# Patient Record
Sex: Male | Born: 1980 | Race: Black or African American | Hispanic: No | Marital: Married | Smoking: Current every day smoker
Health system: Southern US, Community
[De-identification: ages and names within clinical notes are randomized; demographics above are authoritative.]

## PROBLEM LIST (undated history)

## (undated) DIAGNOSIS — I10 Essential (primary) hypertension: Secondary | ICD-10-CM

## (undated) HISTORY — PX: NO PAST SURGERIES: SHX2092

---

## 2004-07-04 ENCOUNTER — Ambulatory Visit (HOSPITAL_COMMUNITY): Admission: RE | Admit: 2004-07-04 | Discharge: 2004-07-04 | Payer: Self-pay | Admitting: Family Medicine

## 2014-08-26 ENCOUNTER — Encounter (HOSPITAL_COMMUNITY): Payer: Self-pay

## 2014-08-26 ENCOUNTER — Emergency Department (INDEPENDENT_AMBULATORY_CARE_PROVIDER_SITE_OTHER)
Admission: EM | Admit: 2014-08-26 | Discharge: 2014-08-26 | Disposition: A | Discharge status: Home / Self Care | Payer: BLUE CROSS/BLUE SHIELD | Source: Home / Self Care | Attending: Emergency Medicine | Admitting: Emergency Medicine

## 2014-08-26 DIAGNOSIS — R319 Hematuria, unspecified: Secondary | ICD-10-CM

## 2014-08-26 LAB — POCT URINALYSIS DIP (DEVICE)
BILIRUBIN URINE: NEGATIVE
GLUCOSE, UA: NEGATIVE mg/dL
Ketones, ur: NEGATIVE mg/dL
LEUKOCYTES UA: NEGATIVE
NITRITE: NEGATIVE
PH: 6.5 (ref 5.0–8.0)
Protein, ur: NEGATIVE mg/dL
Specific Gravity, Urine: 1.025 (ref 1.005–1.030)
Urobilinogen, UA: 0.2 mg/dL (ref 0.0–1.0)

## 2014-08-26 MED ORDER — TAMSULOSIN HCL 0.4 MG PO CAPS: 0.4000 mg | ORAL_CAPSULE | Freq: Every day | ORAL | Status: DC

## 2014-08-26 MED ORDER — PHENAZOPYRIDINE HCL 200 MG PO TABS: 200.0000 mg | ORAL_TABLET | Freq: Three times a day (TID) | ORAL | Status: DC

## 2014-08-26 MED ORDER — CIPROFLOXACIN HCL 500 MG PO TABS: 500.0000 mg | ORAL_TABLET | Freq: Two times a day (BID) | ORAL | Status: DC

## 2014-08-26 NOTE — ED Provider Notes (Signed)
CSN: [id number]     Arrival date & time 08/26/14  0913 History   First MD Initiated Contact with Patient 08/26/14 [phone]     Chief Complaint  Patient presents with  . Hematuria   (Consider location/radiation/quality/duration/timing/severity/associated sxs/prior Treatment) HPI  He is a 34 year old man here for evaluation of hematuria. This started this morning. He also reports dysuria and some frequency. No abdominal pain or fevers. No flank pain. His dad did have a kidney stone about 8 years ago. He denies any need for STD testing. No genital rashes or lesions. No penile discharge.  History reviewed. No pertinent past medical history. History reviewed. No pertinent past surgical history. History reviewed. No pertinent family history. History  Substance Use Topics  . Smoking status: Current Every Day Smoker  . Smokeless tobacco: Not on file  . Alcohol Use: Yes    Review of Systems  Constitutional: Negative for fever.  Gastrointestinal: Negative for abdominal pain.  Genitourinary: Positive for dysuria, frequency and hematuria. Negative for urgency, flank pain, discharge and penile pain.    Allergies  Review of patient's allergies indicates no known allergies.  Home Medications   Prior to Admission medications   Medication Sig Start Date End Date Taking? Authorizing Provider  ciprofloxacin (CIPRO) 500 MG tablet Take 1 tablet (500 mg total) by mouth 2 (two) times daily. 08/26/14   Charm RingsErin J Lisvet Rasheed, MD  phenazopyridine (PYRIDIUM) 200 MG tablet Take 1 tablet (200 mg total) by mouth 3 (three) times daily. 08/26/14   Charm RingsErin J Loyd Salvador, MD  tamsulosin (FLOMAX) 0.4 MG CAPS capsule Take 1 capsule (0.4 mg total) by mouth daily after breakfast. 08/26/14   Charm RingsErin J Alonzo Loving, MD   BP 149/82 mmHg  Pulse 80  Temp(Src) 99.2 F (37.3 C) (Oral)  Resp 18  SpO2 98% Physical Exam  Constitutional: He is oriented to person, place, and time. He appears well-developed and well-nourished. No distress.  Cardiovascular:  Normal rate.   Pulmonary/Chest: Effort normal.  Abdominal: Soft. He exhibits no distension. There is no tenderness. There is no rebound and no guarding.  Genitourinary: Testes normal and penis normal. Circumcised. No penile tenderness. No discharge found.  Lymphadenopathy:       Right: No inguinal adenopathy present.       Left: No inguinal adenopathy present.  Neurological: He is alert and oriented to person, place, and time.    ED Course  Procedures (including critical care time) Labs Review Labs Reviewed  POCT URINALYSIS DIP (DEVICE) - Abnormal; Notable for the following:    Hgb urine dipstick MODERATE (*)    All other components within normal limits  URINE CULTURE    Imaging Review No results found.   MDM   1. Hematuria    Kidney stone versus UTI. As he is a smoker, bladder cancer is a possibility, but felt to be less likely. We'll treat with Cipro for 1 week and Flomax. Discussed that if his symptoms have not resolved after one week, he needs to follow-up with us or his regular doctor for possible referral to urology.    Charm RingsErin J Nayra Coury, MD 08/26/14 1025

## 2014-08-26 NOTE — Discharge Instructions (Signed)
You likely have a bladder infection, but could have a small stone as well. Take Flomax daily for the next week to help the urethra relax. Take cipro twice a day for 1 week. Take pyridium 3 times a day as needed to help with the burning sensation. If the blood does not resolve after 1 week, follow up here or with your regular doctor.

## 2014-08-26 NOTE — ED Notes (Signed)
Reports he had pain and blood w urination this AM x 3 episodes. NAD. No pain at present, only w urination. States his wife has not reported  Issues. Denies drainage, denies pain in back , groin or pelvic floor

## 2014-08-27 LAB — URINE CULTURE
Colony Count: NO GROWTH
Culture: NO GROWTH

## 2014-10-26 ENCOUNTER — Encounter (HOSPITAL_COMMUNITY): Payer: Self-pay

## 2014-10-26 ENCOUNTER — Emergency Department (HOSPITAL_COMMUNITY)
Admission: EM | Admit: 2014-10-26 | Discharge: 2014-10-26 | Disposition: A | Discharge status: Home / Self Care | Payer: BLUE CROSS/BLUE SHIELD | Source: Home / Self Care | Attending: Emergency Medicine | Admitting: Emergency Medicine

## 2014-10-26 DIAGNOSIS — S01511A Laceration without foreign body of lip, initial encounter: Secondary | ICD-10-CM | POA: Diagnosis not present

## 2014-10-26 MED ORDER — BUPIVACAINE-EPINEPHRINE (PF) 0.5% -1:200000 IJ SOLN
INTRAMUSCULAR | Status: AC
  Filled 2014-10-26: qty 3.6

## 2014-10-26 MED ORDER — LIDOCAINE VISCOUS 2 % MT SOLN
OROMUCOSAL | Status: AC
  Filled 2014-10-26: qty 15

## 2014-10-26 NOTE — Discharge Instructions (Signed)
Facial Laceration  A facial laceration is a cut on the face. These injuries can be painful and cause bleeding. Lacerations usually heal quickly, but they need special care to reduce scarring. DIAGNOSIS  Your health care provider will take a medical history, ask for details about how the injury occurred, and examine the wound to determine how deep the cut is. TREATMENT  Some facial lacerations may not require closure. Others may not be able to be closed because of an increased risk of infection. The risk of infection and the chance for successful closure will depend on various factors, including the amount of time since the injury occurred. The wound may be cleaned to help prevent infection. If closure is appropriate, pain medicines may be given if needed. Your health care provider will use stitches (sutures), wound glue (adhesive), or skin adhesive strips to repair the laceration. These tools bring the skin edges together to allow for faster healing and a better cosmetic outcome. If needed, you may also be given a tetanus shot. HOME CARE INSTRUCTIONS  Only take over-the-counter or prescription medicines as directed by your health care provider.  Follow your health care provider's instructions for wound care. These instructions will vary depending on the technique used for closing the wound. For Sutures:  Keep the wound clean and dry.   If you were given a bandage (dressing), you should change it at least once a day. Also change the dressing if it becomes wet or dirty, or as directed by your health care provider.   Wash the wound with soap and water 2 times a day. Rinse the wound off with water to remove all soap. Pat the wound dry with a clean towel.   After cleaning, apply a thin layer of the antibiotic ointment recommended by your health care provider. This will help prevent infection and keep the dressing from sticking.   You may shower as usual after the first 24 hours. Do not soak the  wound in water until the sutures are removed.   Get your sutures removed as directed by your health care provider. With facial lacerations, sutures should usually be taken out after 4-5 days to avoid stitch marks.   Wait a few days after your sutures are removed before applying any makeup. For Skin Adhesive Strips:  Keep the wound clean and dry.   Do not get the skin adhesive strips wet. You may bathe carefully, using caution to keep the wound dry.   If the wound gets wet, pat it dry with a clean towel.   Skin adhesive strips will fall off on their own. You may trim the strips as the wound heals. Do not remove skin adhesive strips that are still stuck to the wound. They will fall off in time.  For Wound Adhesive:  You may briefly wet your wound in the shower or bath. Do not soak or scrub the wound. Do not swim. Avoid periods of heavy sweating until the skin adhesive has fallen off on its own. After showering or bathing, gently pat the wound dry with a clean towel.   Do not apply liquid medicine, cream medicine, ointment medicine, or makeup to your wound while the skin adhesive is in place. This may loosen the film before your wound is healed.   If a dressing is placed over the wound, be careful not to apply tape directly over the skin adhesive. This may cause the adhesive to be pulled off before the wound is healed.   Avoid   prolonged exposure to sunlight or tanning lamps while the skin adhesive is in place.  The skin adhesive will usually remain in place for 5-10 days, then naturally fall off the skin. Do not pick at the adhesive film.  After Healing: Once the wound has healed, cover the wound with sunscreen during the day for 1 full year. This can help minimize scarring. Exposure to ultraviolet light in the first year will darken the scar. It can take 1-2 years for the scar to lose its redness and to heal completely.  SEEK IMMEDIATE MEDICAL CARE IF:  You have redness, pain, or  swelling around the wound.   You see ayellowish-white fluid (pus) coming from the wound.   You have chills or a fever.  MAKE SURE YOU:  Understand these instructions.  Will watch your condition.  Will get help right away if you are not doing well or get worse. Document Released: 08/15/2004 Document Revised: 04/28/2013 Document Reviewed: 02/18/2013 Exit[employer] Patient Information 2015 [employer]. This information is not intended to replace advice given to you by your health care provider. Make sure you discuss any questions you have with your health care provider.  

## 2014-10-26 NOTE — ED Provider Notes (Signed)
CSN: [id number]     Arrival date & time 10/26/14  1935 History   First MD Initiated Contact with Patient 10/26/14 2020     Chief Complaint  Patient presents with  . Lip Laceration   (Consider location/radiation/quality/duration/timing/severity/associated sxs/prior Treatment) HPI      34 year old male with no other medical problems presents complaining of a laceration on his right upper lip. He slipped and hit his face down on a bucket. He sustained a laceration on the right side of his upper lip between the lip and nose. Bleeding has been controlled with direct pressure. There is no numbness. No pain in the teeth in his teeth do not feel loose. His tetanus is up-to-date.  History reviewed. No pertinent past medical history. History reviewed. No pertinent past surgical history. No family history on file. History  Substance Use Topics  . Smoking status: Current Every Day Smoker  . Smokeless tobacco: Not on file  . Alcohol Use: Yes    Review of Systems  Skin: Positive for wound.       See history of present illness  All other systems reviewed and are negative.   Allergies  Review of patient's allergies indicates no known allergies.  Home Medications   Prior to Admission medications   Medication Sig Start Date End Date Taking? Authorizing Provider  ciprofloxacin (CIPRO) 500 MG tablet Take 1 tablet (500 mg total) by mouth 2 (two) times daily. 08/26/14   Charm RingsErin J Honig, MD  phenazopyridine (PYRIDIUM) 200 MG tablet Take 1 tablet (200 mg total) by mouth 3 (three) times daily. 08/26/14   Charm RingsErin J Honig, MD  tamsulosin (FLOMAX) 0.4 MG CAPS capsule Take 1 capsule (0.4 mg total) by mouth daily after breakfast. 08/26/14   Charm RingsErin J Honig, MD   BP 147/86 mmHg  Pulse 111  Temp(Src) 97.4 F (36.3 C) (Oral)  Resp 16  SpO2 100% Physical Exam  Constitutional: He is oriented to person, place, and time. He appears well-developed and well-nourished. No distress.  HENT:  Head: Normocephalic.  Mouth/Throat:  Lacerations present.    Pulmonary/Chest: Effort normal. No respiratory distress.  Neurological: He is alert and oriented to person, place, and time. Coordination normal.  Skin: Skin is warm and dry. No rash noted. He is not diaphoretic.  Psychiatric: He has a normal mood and affect. Judgment normal.  Nursing note and vitals reviewed.   ED Course  LACERATION REPAIR Date/Time: 10/26/2014 9:26 PM Performed by: Graylon GoodBAKER, Tifany Hirsch H Authorized by: Charm RingsHONIG, ERIN J Consent: Verbal consent obtained. Risks and benefits: risks, benefits and alternatives were discussed Consent given by: patient Patient identity confirmed: verbally with patient Time out: Immediately prior to procedure a "time out" was called to verify the correct patient, procedure, equipment, support staff and site/side marked as required. Body area: mouth (Right upper lip between the vermilion border and the nose) Laceration length: 3 cm Foreign bodies: no foreign bodies Tendon involvement: none Nerve involvement: none Vascular damage: no Anesthesia: nerve block (Right infraorbital nerve block) Local anesthetic: bupivacaine 0.5% with epinephrine Anesthetic total: 1.8 ml Patient sedated: no Preparation: Patient was prepped and draped in the usual sterile fashion. Irrigation solution: saline Irrigation method: syringe Amount of cleaning: extensive Debridement: none Degree of undermining: none Skin closure: 5-0 Prolene Number of sutures: 5 Technique: simple Approximation: close Approximation difficulty: complex Dressing: antibiotic ointment Patient tolerance: Patient tolerated the procedure well with no immediate complications   (including critical care time) Labs Review Labs Reviewed - No data to display  Imaging  Review No results found.   MDM   1. Laceration of lip, initial encounter    Laceration, repaired with 5 simple interrupted sutures with 5-0 Prolene. He will leave his in for a period of one week and then  come back in to have them removed. This laceration is relatively superficial and was cleaned extensively, no need for prophylactic antibiotics. He will follow-up if he sees any signs of infection which were reviewed with the patient in detail. Ibuprofen or Tylenol for pain    Graylon Good, PA-C 10/26/14 2129

## 2014-10-26 NOTE — ED Notes (Signed)
Patient states was in his garage at home and slipped And hit his upper lip on and industrial bucket Patient states he is up to date with his tetanus

## 2014-11-03 ENCOUNTER — Encounter (HOSPITAL_COMMUNITY): Payer: Self-pay | Admitting: Emergency Medicine

## 2014-11-03 ENCOUNTER — Emergency Department (INDEPENDENT_AMBULATORY_CARE_PROVIDER_SITE_OTHER)
Admission: EM | Admit: 2014-11-03 | Discharge: 2014-11-03 | Disposition: A | Discharge status: Home / Self Care | Payer: BLUE CROSS/BLUE SHIELD | Source: Home / Self Care

## 2014-11-03 DIAGNOSIS — S01511D Laceration without foreign body of lip, subsequent encounter: Secondary | ICD-10-CM | POA: Diagnosis not present

## 2014-11-03 DIAGNOSIS — Z4802 Encounter for removal of sutures: Secondary | ICD-10-CM | POA: Diagnosis not present

## 2014-11-03 NOTE — Discharge Instructions (Signed)
The 5 sutures were removed from your face. The wound is healing well. Please avoid any contact sports for another 2-3 weeks Use vitamin E products to help soften the scar

## 2014-11-03 NOTE — ED Provider Notes (Signed)
CSN: [id number]     Arrival date & time 11/03/14  [id number] History   None    Chief Complaint  Patient presents with  . Suture / Staple Removal   (Consider location/radiation/quality/duration/timing/severity/associated sxs/prior Treatment) HPI Sutures placed 8 days ago after suffering traumatic facial injury. Patient has kept the area clean with soap and water. Patient keeps a beard and does not shave the area. Denies any tenderness, discharge, fevers, nausea, vomiting, chest pain, palpitations, headache, neck stiffness. Problem is constant but improving.   History reviewed. No pertinent past medical history. History reviewed. No pertinent past surgical history. Family History  Problem Relation Age of Onset  . Hypertension Mother   . Diabetes Father    History  Substance Use Topics  . Smoking status: Current Every Day Smoker  . Smokeless tobacco: Not on file  . Alcohol Use: Yes    Review of Systems Per HPI with all other pertinent systems negative.   Allergies  Review of patient's allergies indicates no known allergies.  Home Medications   Prior to Admission medications   Not on File   BP 134/86 mmHg  Pulse 92  Temp(Src) 98.3 F (36.8 C) (Oral)  Resp 16  SpO2 97% Physical Exam Physical Exam  Constitutional: oriented to person, place, and time. appears well-developed and well-nourished. No distress.  HENT:  Head: Normocephalic and atraumatic.  Eyes: EOMI. PERRL.  Neck: Normal range of motion.  Cardiovascular: RRR, no m/r/g, 2+ distal pulses,  Pulmonary/Chest: Effort normal and breath sounds normal. No respiratory distress.  Abdominal: Soft. Bowel sounds are normal. NonTTP, no distension.  Musculoskeletal: Normal range of motion. Non ttp, no effusion.  Neurological: alert and oriented to person, place, and time.  Skin: R upper lip w/ 5 interrupted prolene sutures in place. Pt has a beard. Sutures removed w/ sterile scissors  and forceps  . Laceration looked well  appearing and is without significant induration, no erythema, purulence and nontender to palpation. \ \\ Psychiatric: normal mood and affect. behavior is normal. Judgment and thought content normal.   ED Course  Procedures (including critical care time) Labs Review Labs Reviewed - No data to display  Imaging Review No results found.   MDM   1. Visit for suture removal   2. Lip laceration, subsequent encounter    . 5 Prolene sutures removed without consultation. Wound care instructions given to patient. Follow-up with PCP or here as needed.    Ozella Rocksavid J Joesph Marcy, MD 11/03/14 [phone]

## 2014-11-03 NOTE — ED Notes (Signed)
Pt here for suture removal from lip.  Voices no concerns at this time.  Wound appears well healed with no signs of infection.

## 2014-11-15 ENCOUNTER — Encounter (HOSPITAL_COMMUNITY): Payer: Self-pay | Admitting: Emergency Medicine

## 2014-11-15 ENCOUNTER — Emergency Department (HOSPITAL_COMMUNITY)
Admission: EM | Admit: 2014-11-15 | Discharge: 2014-11-15 | Disposition: A | Discharge status: Home / Self Care | Payer: BLUE CROSS/BLUE SHIELD | Attending: Emergency Medicine | Admitting: Emergency Medicine

## 2014-11-15 DIAGNOSIS — Y9289 Other specified places as the place of occurrence of the external cause: Secondary | ICD-10-CM | POA: Insufficient documentation

## 2014-11-15 DIAGNOSIS — Y9389 Activity, other specified: Secondary | ICD-10-CM | POA: Diagnosis not present

## 2014-11-15 DIAGNOSIS — S91114A Laceration without foreign body of right lesser toe(s) without damage to nail, initial encounter: Secondary | ICD-10-CM | POA: Diagnosis not present

## 2014-11-15 DIAGNOSIS — Y998 Other external cause status: Secondary | ICD-10-CM | POA: Insufficient documentation

## 2014-11-15 DIAGNOSIS — W271XXA Contact with garden tool, initial encounter: Secondary | ICD-10-CM | POA: Diagnosis not present

## 2014-11-15 DIAGNOSIS — Z720 Tobacco use: Secondary | ICD-10-CM | POA: Insufficient documentation

## 2014-11-15 DIAGNOSIS — S91311A Laceration without foreign body, right foot, initial encounter: Secondary | ICD-10-CM

## 2014-11-15 MED ORDER — LIDOCAINE HCL (PF) 1 % IJ SOLN
5.0000 mL | Freq: Once | INTRAMUSCULAR | Status: AC
Start: 2014-11-15 — End: 2014-11-15
  Administered 2014-11-15: 5 mL via INTRADERMAL
  Filled 2014-11-15: qty 5

## 2014-11-15 NOTE — ED Notes (Signed)
Patient states stepped on a garden tool.  Patient states cut between his 4th and 5th toe "up underneath".   Bleeding controlled at this time.   Patient denies other symptoms.

## 2014-11-15 NOTE — Discharge Instructions (Signed)
Keep wound area clean. Refer to attached documents for more information. Return to the ED in 10 days for suture removal.  °

## 2014-11-15 NOTE — ED Provider Notes (Signed)
CSN: [id number]     Arrival date & time 11/15/14  0708 History   First MD Initiated Contact with Patient 11/15/14 [phone]     Chief Complaint  Patient presents with  . Laceration     (Consider location/radiation/quality/duration/timing/severity/associated sxs/prior Treatment) Patient is a 34 y.o. male presenting with skin laceration. The history is provided by the patient. No language interpreter was used.  Laceration Location:  Foot Foot laceration location:  Sole of R foot Length (cm):  2cm (2 separate wounds) Depth:  Through dermis Quality: straight   Bleeding: controlled   Time since incident:  1 hour Injury mechanism: garden tool. Pain details:    Quality:  Aching   Severity:  Moderate   Timing:  Constant   Progression:  Unchanged Foreign body present:  No foreign bodies Relieved by:  Nothing Worsened by:  Nothing tried Ineffective treatments:  None tried Tetanus status:  Up to date   History reviewed. No pertinent past medical history. History reviewed. No pertinent past surgical history. Family History  Problem Relation Age of Onset  . Hypertension Mother   . Diabetes Father    History  Substance Use Topics  . Smoking status: Current Every Day Smoker  . Smokeless tobacco: Not on file  . Alcohol Use: Yes    Review of Systems  Skin: Positive for wound.  All other systems reviewed and are negative.     Allergies  Review of patient's allergies indicates no known allergies.  Home Medications   Prior to Admission medications   Not on File   BP 152/95 mmHg  Pulse 96  Temp(Src) 98.6 F (37 C) (Oral)  Resp 18  SpO2 99% Physical Exam  Constitutional: He is oriented to person, place, and time. He appears well-developed and well-nourished. No distress.  HENT:  Head: Normocephalic and atraumatic.  Eyes: Conjunctivae are normal.  Neck: Normal range of motion.  Cardiovascular: Normal rate and regular rhythm.  Exam reveals no gallop and no friction rub.    No murmur heard. Pulmonary/Chest: Effort normal and breath sounds normal. He has no wheezes. He has no rales. He exhibits no tenderness.  Abdominal: Soft. There is no tenderness.  Musculoskeletal: Normal range of motion.  Patient is able to wiggle toes.   Neurological: He is alert and oriented to person, place, and time. Coordination normal.  Speech is goal-oriented. Moves limbs without ataxia.   Skin: Skin is warm and dry.  2cm laceration of right plantar surface of the right fifth toe and right fourth toe.   Psychiatric: He has a normal mood and affect. His behavior is normal.  Nursing note and vitals reviewed.   ED Course  Procedures (including critical care time) Labs Review Labs Reviewed - No data to display  LACERATION REPAIR Performed by: Emilia BeckKaitlyn Collin Rengel Authorized by: Emilia BeckKaitlyn Drelyn Pistilli Consent: Verbal consent obtained. Risks and benefits: risks, benefits and alternatives were discussed Consent given by: patient Patient identity confirmed: provided demographic data Prepped and Draped in normal sterile fashion Wound explored  Laceration Location: plantar surface of right foot (fourth and fifth toe)  Laceration Length: 2cm  No Foreign Bodies seen or palpated  Anesthesia: local infiltration  Local anesthetic: lidocaine 1% without epinephrine  Anesthetic total: 2 ml  Irrigation method: syringe Amount of cleaning: standard  Skin closure: 4-0 ethilon   Number of sutures: 5  Technique: simple  Patient tolerance: Patient tolerated the procedure well with no immediate complications.   Imaging Review No results found.   EKG Interpretation None  MDM   Final diagnoses:  Laceration of right foot, initial encounter    8:56 AM Patient's laceration repaired. Tdap UTD. No other injury. No neurovascular compromise. Patient instructed to return in 10 days for suture removal. Vitals stable and patient afebrile.     Emilia Beck, PA-C 11/15/14  0901  Samuel Jester, DO 11/16/14 [phone]

## 2014-11-25 ENCOUNTER — Emergency Department (HOSPITAL_COMMUNITY)
Admission: EM | Admit: 2014-11-25 | Discharge: 2014-11-25 | Disposition: A | Discharge status: Home / Self Care | Payer: BLUE CROSS/BLUE SHIELD | Source: Home / Self Care | Attending: Family Medicine | Admitting: Family Medicine

## 2014-11-25 ENCOUNTER — Encounter (HOSPITAL_COMMUNITY): Payer: Self-pay | Admitting: *Deleted

## 2014-11-25 DIAGNOSIS — Z4802 Encounter for removal of sutures: Secondary | ICD-10-CM

## 2014-11-25 NOTE — Discharge Instructions (Signed)
Return as needed

## 2014-11-25 NOTE — ED Provider Notes (Signed)
CSN: [id number]     Arrival date & time 11/25/14  0802 History   None    No chief complaint on file.  (Consider location/radiation/quality/duration/timing/severity/associated sxs/prior Treatment) Patient is a 34 y.o. male presenting with suture removal. The history is provided by the patient.  Suture / Staple Removal This is a new problem. The current episode started more than 1 week ago. The problem has been resolved. Associated symptoms comments: No problems assoc with lac.    No past medical history on file. No past surgical history on file. Family History  Problem Relation Age of Onset  . Hypertension Mother   . Diabetes Father    History  Substance Use Topics  . Smoking status: Current Every Day Smoker  . Smokeless tobacco: Not on file  . Alcohol Use: Yes    Review of Systems  Constitutional: Negative.   Skin: Positive for wound.    Allergies  Review of patient's allergies indicates no known allergies.  Home Medications   Prior to Admission medications   Not on File   BP 137/102 mmHg  Pulse 92  Temp(Src) 99.7 F (37.6 C) (Oral)  Resp 16  SpO2 93% Physical Exam  Constitutional: He is oriented to person, place, and time. He appears well-developed and well-nourished.  Musculoskeletal: Normal range of motion. He exhibits no tenderness.  Neurological: He is alert and oriented to person, place, and time.  Skin: Skin is warm and dry.  2 lacs 5 stitches, well healed, sr .  Nursing note and vitals reviewed.   ED Course  Procedures (including critical care time) Labs Review Labs Reviewed - No data to display  Imaging Review No results found.   MDM   1. Encounter for removal of sutures        Linna HoffJames D Marsena Taff, MD 11/25/14 [phone]

## 2014-11-25 NOTE — ED Notes (Signed)
HERE   FOR  SUTURE  REMOVAL           SUTURES READY  TO   COME  OUT  APPEARS  WELL   HEALING

## 2016-04-24 ENCOUNTER — Ambulatory Visit
Admission: RE | Admit: 2016-04-24 | Discharge: 2016-04-24 | Disposition: A | Discharge status: Home / Self Care | Payer: BLUE CROSS/BLUE SHIELD | Source: Ambulatory Visit | Attending: Family Medicine | Admitting: Family Medicine

## 2016-04-24 ENCOUNTER — Other Ambulatory Visit: Payer: Self-pay | Admitting: Family Medicine

## 2016-04-24 DIAGNOSIS — R52 Pain, unspecified: Secondary | ICD-10-CM

## 2016-11-21 ENCOUNTER — Encounter: Payer: Self-pay | Admitting: Podiatry

## 2016-11-21 ENCOUNTER — Ambulatory Visit (INDEPENDENT_AMBULATORY_CARE_PROVIDER_SITE_OTHER): Payer: BLUE CROSS/BLUE SHIELD | Admitting: Podiatry

## 2016-11-21 DIAGNOSIS — M79675 Pain in left toe(s): Secondary | ICD-10-CM

## 2016-11-21 DIAGNOSIS — G8929 Other chronic pain: Secondary | ICD-10-CM

## 2016-11-21 DIAGNOSIS — B351 Tinea unguium: Secondary | ICD-10-CM

## 2016-11-21 DIAGNOSIS — L603 Nail dystrophy: Secondary | ICD-10-CM

## 2016-11-21 NOTE — Patient Instructions (Signed)

## 2016-11-25 NOTE — Progress Notes (Signed)
Subjective:    Patient ID: Daniel Shea, male   DOB: 36 y.o.   MRN: [id number]   HPI [age] year old male presents the office today for concerns of his left big toenail becoming painful as well as thick most of the top of the toenail. He states the areas painful pressure in shoes. Denies he swelling or redness or any drainage. He doesn't typically get pedicures. This is been ongoing for about 1 year. He said no other treatment. No other complaints.   Review of Systems  All other systems reviewed and are negative.       Objective:  Physical Exam General: AAO x3, NAD  Dermatological: The left hallux toenail appears to be hypertrophic, dystrophic, discolored a pincer type nail. There is tenderness the dorsal aspect of the toenail as well as the nail corners. There is no swelling edema, erythema, drainage or pus. There is no conical signs of infection today. There is no open lesions identified.  Vascular: Dorsalis Pedis artery and Posterior Tibial artery pedal pulses are 2/4 bilateral with immedate capillary fill time. Pedal hair growth present. No varicosities and no lower extremity edema present bilateral. There is no pain with calf compression, swelling, warmth, erythema.   Neruologic: Grossly intact via light touch bilateral. Vibratory intact via tuning fork bilateral. Protective threshold with Semmes Wienstein monofilament intact to all pedal sites bilateral.   Musculoskeletal: No gross boney pedal deformities bilateral. No pain, crepitus, or limitation noted with foot and ankle range of motion bilateral. Muscular strength 5/5 in all groups tested bilateral.  Gait: Unassisted, Nonantalgic.      Assessment:     [age] year old male left hallux symptomatic onychodystrophy    Plan:     -Treatment options discussed including all alternatives, risks, and complications -Etiology of symptoms were discussed -At this time, recommended total nail removal without chemical matricectomy to the left  hallux due to dystrophy and pain. We will send the nail for culture and likely start treatment to HELP the nail come back in better but this is not a guarantee. He understands this can make it worse as well.  Risks and complications were discussed with the patient for which they understand and  verbally consent to the procedure. Under sterile conditions a total of 3 mL of a mixture of 2% lidocaine plain and 0.5% Marcaine plain was infiltrated in a hallux block fashion. Once anesthetized, the skin was prepped in sterile fashion. A tourniquet was then applied. Next the left hallux nail was excised making sure to remove the entire offending nail border. Once the nail was  Removed, the area was debrided and the underlying skin was intact. The area was irrigated and hemostasis was obtained.  A dry sterile dressing was applied. After application of the dressing the tourniquet was removed and there is found to be an immediate capillary refill time to the digit. The patient tolerated the procedure well any complications. Post procedure instructions were discussed the patient for which he verbally understood. Follow-up in one week for nail check or sooner if any problems are to arise. Discussed signs/symptoms of worsening infection and directed to call the office immediately should any occur or go directly to the emergency room. In the meantime, encouraged to call the office with any questions, concerns, changes symptoms.  Ovid CurdMatthew Wagoner, DPM

## 2016-12-06 ENCOUNTER — Ambulatory Visit: Payer: BLUE CROSS/BLUE SHIELD | Admitting: Podiatry

## 2016-12-06 ENCOUNTER — Ambulatory Visit (INDEPENDENT_AMBULATORY_CARE_PROVIDER_SITE_OTHER): Payer: Self-pay | Admitting: Podiatry

## 2016-12-06 DIAGNOSIS — Z79899 Other long term (current) drug therapy: Secondary | ICD-10-CM

## 2016-12-06 DIAGNOSIS — L603 Nail dystrophy: Secondary | ICD-10-CM

## 2016-12-06 LAB — HEPATIC FUNCTION PANEL
ALT: 34 U/L (ref 9–46)
AST: 23 U/L (ref 10–40)
Albumin: 4 g/dL (ref 3.6–5.1)
Alkaline Phosphatase: 60 U/L (ref 40–115)
Bilirubin, Direct: 0.1 mg/dL (ref <=0.2)
Indirect Bilirubin: 0.3 mg/dL (ref 0.2–1.2)
Total Bilirubin: 0.4 mg/dL (ref 0.2–1.2)
Total Protein: 6.6 g/dL (ref 6.1–8.1)

## 2016-12-06 MED ORDER — TERBINAFINE HCL 250 MG PO TABS: 250.0000 mg | ORAL_TABLET | Freq: Every day | ORAL | 2 refills | Status: DC

## 2016-12-06 NOTE — Progress Notes (Signed)
Patient present status post complete nail avulsion Lt hallux nail   He denies pain at this time. Nail bed healing well, no erythema, drainage or swelling. No other s/s of infection noted. He has stopped osaking at this time. Advised to bandage during the day and take off at night.  Discussed positive nail culture results with patient, and options to treat. Options, risks and alternatives were discussed with patient and he wants to proceed with oral medication lamisil. I advised him that we would need to get bloodwork done to check his hepatic function and that once we received the results we would contact him with instructions on whether to begin the medication or treat with another option such as a topical. He verbalized understanding of instructions and lab work was ordered.

## 2017-10-28 ENCOUNTER — Ambulatory Visit (INDEPENDENT_AMBULATORY_CARE_PROVIDER_SITE_OTHER): Payer: 59 | Admitting: Podiatry

## 2017-10-28 ENCOUNTER — Encounter: Payer: Self-pay | Admitting: Podiatry

## 2017-10-28 DIAGNOSIS — Z79899 Other long term (current) drug therapy: Secondary | ICD-10-CM

## 2017-10-28 DIAGNOSIS — B351 Tinea unguium: Secondary | ICD-10-CM | POA: Diagnosis not present

## 2017-10-28 LAB — CBC WITH DIFFERENTIAL/PLATELET
Basophils Absolute: 81 cells/uL (ref 0–200)
Basophils Relative: 1 %
Eosinophils Absolute: 113 cells/uL (ref 15–500)
Eosinophils Relative: 1.4 %
HCT: 47.2 % (ref 38.5–50.0)
Hemoglobin: 15.9 g/dL (ref 13.2–17.1)
Lymphs Abs: 2438 cells/uL (ref 850–3900)
MCH: 29.7 pg (ref 27.0–33.0)
MCHC: 33.7 g/dL (ref 32.0–36.0)
MCV: 88.2 fL (ref 80.0–100.0)
MPV: 11.1 fL (ref 7.5–12.5)
Monocytes Relative: 9 %
Neutro Abs: 4739 cells/uL (ref 1500–7800)
Neutrophils Relative %: 58.5 %
Platelets: 301 10*3/uL (ref 140–400)
RBC: 5.35 10*6/uL (ref 4.20–5.80)
RDW: 11.9 % (ref 11.0–15.0)
Total Lymphocyte: 30.1 %
WBC mixed population: 729 cells/uL (ref 200–950)
WBC: 8.1 10*3/uL (ref 3.8–10.8)

## 2017-10-28 LAB — HEPATIC FUNCTION PANEL
AG Ratio: 1.5 (calc) (ref 1.0–2.5)
ALT: 27 U/L (ref 9–46)
AST: 22 U/L (ref 10–40)
Albumin: 4.3 g/dL (ref 3.6–5.1)
Alkaline phosphatase (APISO): 63 U/L (ref 40–115)
Bilirubin, Direct: 0.1 mg/dL (ref 0.0–0.2)
Globulin: 2.8 g/dL (calc) (ref 1.9–3.7)
Indirect Bilirubin: 0.2 mg/dL (calc) (ref 0.2–1.2)
Total Bilirubin: 0.3 mg/dL (ref 0.2–1.2)
Total Protein: 7.1 g/dL (ref 6.1–8.1)

## 2017-10-28 MED ORDER — TERBINAFINE HCL 250 MG PO TABS: 250.0000 mg | ORAL_TABLET | Freq: Every day | ORAL | 0 refills | Status: DC

## 2017-10-29 ENCOUNTER — Telehealth: Payer: Self-pay | Admitting: *Deleted

## 2017-10-29 NOTE — Telephone Encounter (Signed)
-----   Message from Vivi BarrackMatthew R Wagoner, DPM sent at 10/29/2017  3:38 PM EDT ----- Val- please continue let him know that his blood work is normal and can start Lamisil- recheck in 6 weeks. Thanks.

## 2017-10-29 NOTE — Telephone Encounter (Signed)
I informed pt of Dr. Wagoner's review of results and orders. 

## 2017-10-29 NOTE — Progress Notes (Signed)
Subjective: Fayrene FearingJames presents to the office today for concerns of his right big toe nail becoming thick and cause pain of his pressure directly on the toenail.  Denies any redness or drainage or any swelling.  He like to get treatment for this toenail before it gets to the point with the left toenail was.  Denies any swelling or redness or any drainage or pus coming from the toenail.  He has no other concerns. Denies any systemic complaints such as fevers, chills, nausea, vomiting. No acute changes since last appointment, and no other complaints at this time.   Objective: AAO x3, NAD DP/PT pulses palpable bilaterally, CRT less than 3 seconds The right hallux toenail is hypertrophic, dystrophic with yellow to brown discoloration mostly towards the distal portion of the nail.  Lesser digits also has some discoloration with nail fungus but there is no pain to any the nails and there is no surrounding redness or drainage or any swelling or any signs of infection. No open lesions or pre-ulcerative lesions.  No pain with calf compression, swelling, warmth, erythema  Assessment: Onychomycosis  Plan: -All treatment options discussed with the patient including all alternatives, risks, complications.  -Discussed she denies her onychomycosis.  I think the best thing given the thickness and will help save the toenail to be did not do another round of oral Lamisil.  Discussed side effects as well as success rates.  He wishes to proceed.  We will check a CBC and LFT prior to starting the medication and he is not to start to call him and he verbally understood this.  Follow-up in 6 weeks or sooner if needed. -Patient encouraged to call the office with any questions, concerns, change in symptoms.   Vivi BarrackMatthew R Wagoner DPM

## 2017-12-09 ENCOUNTER — Ambulatory Visit: Payer: 59 | Admitting: Podiatry

## 2018-02-06 DIAGNOSIS — Z13220 Encounter for screening for lipoid disorders: Secondary | ICD-10-CM | POA: Diagnosis not present

## 2018-02-06 DIAGNOSIS — Z0000 Encounter for general adult medical examination without abnormal findings: Secondary | ICD-10-CM | POA: Diagnosis not present

## 2018-02-06 DIAGNOSIS — E559 Vitamin D deficiency, unspecified: Secondary | ICD-10-CM | POA: Diagnosis not present

## 2018-02-06 DIAGNOSIS — R7301 Impaired fasting glucose: Secondary | ICD-10-CM | POA: Diagnosis not present

## 2018-10-23 DIAGNOSIS — F17210 Nicotine dependence, cigarettes, uncomplicated: Secondary | ICD-10-CM | POA: Diagnosis not present

## 2018-10-23 DIAGNOSIS — J029 Acute pharyngitis, unspecified: Secondary | ICD-10-CM | POA: Diagnosis not present

## 2018-12-09 DIAGNOSIS — I10 Essential (primary) hypertension: Secondary | ICD-10-CM | POA: Diagnosis not present

## 2019-08-19 ENCOUNTER — Ambulatory Visit: Payer: 59 | Attending: Internal Medicine

## 2019-08-23 ENCOUNTER — Ambulatory Visit: Payer: 59 | Attending: Internal Medicine

## 2019-08-23 DIAGNOSIS — Z20822 Contact with and (suspected) exposure to covid-19: Secondary | ICD-10-CM

## 2019-08-24 LAB — NOVEL CORONAVIRUS, NAA: SARS-CoV-2, NAA: NOT DETECTED

## 2020-08-28 ENCOUNTER — Other Ambulatory Visit: Payer: Self-pay | Admitting: Family Medicine

## 2020-08-28 DIAGNOSIS — R1033 Periumbilical pain: Secondary | ICD-10-CM

## 2020-09-13 ENCOUNTER — Other Ambulatory Visit: Payer: Self-pay

## 2020-09-13 ENCOUNTER — Ambulatory Visit
Admission: RE | Admit: 2020-09-13 | Discharge: 2020-09-13 | Disposition: A | Discharge status: Home / Self Care | Payer: 59 | Source: Ambulatory Visit | Attending: Family Medicine | Admitting: Family Medicine

## 2020-09-13 DIAGNOSIS — R1033 Periumbilical pain: Secondary | ICD-10-CM

## 2020-09-13 MED ORDER — IOPAMIDOL (ISOVUE-300) INJECTION 61%
100.0000 mL | Freq: Once | INTRAVENOUS | Status: AC | PRN
  Administered 2020-09-13: 100 mL via INTRAVENOUS

## 2020-10-09 ENCOUNTER — Ambulatory Visit: Payer: Self-pay | Admitting: Surgery

## 2020-10-09 NOTE — H&P (Signed)
Daniel Shea Appointment: 10/09/2020 11:00 AM Location: [facility] Patient #: [id number] DOB: 02-21-1981 Married / Language: English / Race: Black or African American Male  History of Present Illness Daniel Fus A. Margot Oriordan MD; 10/09/2020 12:37 PM) Patient words: Patient is sent at the request of Dr Mallie Snooks due to periumbilical pain 3 months. The patient states he "a burning pain sensation around his umbilicus which actually got worse back in February. He was seen by his primary care physician who ordered a computed tomography scan which showed a small periumbilical hernia without signs of incarceration or strangulation. Currently, the patient is pain free. He has no nausea or vomiting. No change in bowel or bladder function. Computed tomography scan shows that the umbilical hernia is to the right of the umbilicus without complicating feature.  The patient is a 40 year old male.   Past Surgical History Marliss Coots, CNA; 10/09/2020 11:22 AM) Oral Surgery  Diagnostic Studies History Marliss Coots, CNA; 10/09/2020 11:22 AM) Colonoscopy never  Allergies Marliss Coots, CNA; 10/09/2020 11:22 AM) No Known Drug Allergies [10/09/2020]: Allergies Reconciled  Medication History Marliss Coots, CNA; 10/09/2020 11:24 AM) amLODIPine Besylate (5MG  Tablet, Oral) Active. Medications Reconciled hydroCHLOROthiazide (50MG  Tablet, Oral) Active. Lamisil (1% Cream, External) Active.  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 Caffeine use Carbonated beverages, Coffee. Illicit drug use Uses socially only. Tobacco use Current every day smoker.  Family History Marliss Coots, CNA; 10/09/2020 11:22 AM) Alcohol Abuse Family Members In General. Breast Cancer Family Members In General. Depression Brother. Diabetes Mellitus Father. Hypertension Family Members In General, Mother. Prostate Cancer Family Members In  General. Respiratory Condition Family Members In General. Thyroid problems Mother.  Other Problems Marliss Coots, CNA; 10/09/2020 11:22 AM) Alcohol Abuse Back Pain High blood pressure Other disease, cancer, significant illness     Review of Systems (Donyelle Alston CNA; 10/09/2020 11:22 AM) General Not Present- Appetite Loss, Chills, Fatigue, Fever, Night Sweats, Weight Gain and Weight Loss. Skin Not Present- Change in Wart/Mole, Dryness, Hives, Jaundice, New Lesions, Non-Healing Wounds, Rash and Ulcer. HEENT Not Present- Earache, Hearing Loss, Hoarseness, Nose Bleed, Oral Ulcers, Ringing in the Ears, Seasonal Allergies, Sinus Pain, Sore Throat, Visual Disturbances, Wears glasses/contact lenses and Yellow Eyes. Respiratory Not Present- Bloody sputum, Chronic Cough, Difficulty Breathing, Snoring and Wheezing. Breast Not Present- Breast Mass, Breast Pain, Nipple Discharge and Skin Changes. Cardiovascular Not Present- Chest Pain, Difficulty Breathing Lying Down, Leg Cramps, Palpitations, Rapid Heart Rate, Shortness of Breath and Swelling of Extremities. Gastrointestinal Present- Abdominal Pain. Not Present- Bloating, Bloody Stool, Change in Bowel Habits, Chronic diarrhea, Constipation, Difficulty Swallowing, Excessive gas, Gets full quickly at meals, Hemorrhoids, Indigestion, Nausea, Rectal Pain and Vomiting. Male Genitourinary Not Present- Blood in Urine, Change in Urinary Stream, Frequency, Impotence, Nocturia, Painful Urination, Urgency and Urine Leakage.  Vitals 10/11/2020 Alston CNA; 10/09/2020 11:23 AM) 10/09/2020 11:22 AM Weight: 296.25 lb Height: 73in Body Surface Area: 2.54 m Body Mass Index: 39.09 kg/m  Temp.: 98.68F  Pulse: 94 (Regular)  P.OX: 97% (Room air) BP: 160/90(Sitting, Left Arm, Standard)        Physical Exam (Daizy Outen A. Avelino Herren MD; 10/09/2020 12:38 PM)  General Mental Status-Alert. General Appearance-Consistent with stated  age. Hydration-Well hydrated. Voice-Normal.  Head and Neck Head-normocephalic, atraumatic with no lesions or palpable masses. Trachea-midline. Thyroid Gland Characteristics - normal size and consistency.  Chest and Lung Exam Chest and lung exam reveals -quiet, even and easy respiratory effort with no use of accessory muscles and  on auscultation, normal breath sounds, no adventitious sounds and normal vocal resonance. Inspection Chest Wall - Normal. Back - normal.  Cardiovascular Cardiovascular examination reveals -normal heart sounds, regular rate and rhythm with no murmurs and normal pedal pulses bilaterally.  Abdomen Note: Small umbilical hernia noted. This is just to the right of the umbilicus. It is small and reducible. Mild tenderness to palpation. No signs of incarceration or strangulation on today's exam.  Neurologic Neurologic evaluation reveals -alert and oriented x 3 with no impairment of recent or remote memory. Mental Status-Normal.  Musculoskeletal Normal Exam - Left-Upper Extremity Strength Normal and Lower Extremity Strength Normal. Normal Exam - Right-Upper Extremity Strength Normal and Lower Extremity Strength Normal.    Assessment & Plan (Oluwatimileyin Vivier A. Myangel Summons MD; 10/09/2020 12:38 PM)  UMBILICAL HERNIA WITHOUT OBSTRUCTION AND WITHOUT GANGRENE (K42.9) Impression: Recommend repair of his umbilical hernia with mesh since it is symptomatic. The risk of hernia repair include bleeding, infection, organ injury, bowel injury, bladder injury, nerve injury recurrent hernia, blood clots, worsening of underlying condition, chronic pain, mesh use, open surgery, death, and the need for other operations. Pt agrees to proceed  Current Plans Pt Education - CCS Mesh education: discussed with patient and provided information. Pt Education - Pamphlet Given - Hernia Surgery: discussed with patient and provided information. CCS Consent - Hernia Repair -  Ventral/Incisional/Umbilical (: discussed with patient and provided information.

## 2020-11-07 ENCOUNTER — Other Ambulatory Visit: Payer: Self-pay

## 2020-11-07 ENCOUNTER — Encounter (HOSPITAL_BASED_OUTPATIENT_CLINIC_OR_DEPARTMENT_OTHER): Payer: Self-pay | Admitting: Surgery

## 2020-11-13 ENCOUNTER — Other Ambulatory Visit (HOSPITAL_COMMUNITY)
Admission: RE | Admit: 2020-11-13 | Discharge: 2020-11-13 | Disposition: A | Discharge status: Home / Self Care | Payer: 59 | Source: Ambulatory Visit | Attending: Surgery | Admitting: Surgery

## 2020-11-13 ENCOUNTER — Encounter (HOSPITAL_BASED_OUTPATIENT_CLINIC_OR_DEPARTMENT_OTHER)
Admission: RE | Admit: 2020-11-13 | Discharge: 2020-11-13 | Disposition: A | Discharge status: Home / Self Care | Payer: 59 | Source: Ambulatory Visit | Attending: Surgery | Admitting: Surgery

## 2020-11-13 DIAGNOSIS — Z20822 Contact with and (suspected) exposure to covid-19: Secondary | ICD-10-CM | POA: Insufficient documentation

## 2020-11-13 DIAGNOSIS — Z01812 Encounter for preprocedural laboratory examination: Secondary | ICD-10-CM | POA: Insufficient documentation

## 2020-11-13 NOTE — Progress Notes (Signed)

## 2020-11-14 ENCOUNTER — Encounter (HOSPITAL_BASED_OUTPATIENT_CLINIC_OR_DEPARTMENT_OTHER): Payer: Self-pay | Admitting: Surgery

## 2020-11-14 LAB — SARS CORONAVIRUS 2 (TAT 6-24 HRS): SARS Coronavirus 2: NEGATIVE

## 2020-11-14 NOTE — Anesthesia Preprocedure Evaluation (Addendum)
Anesthesia Evaluation  Patient identified by MRN, date of birth, ID band Patient awake    Reviewed: Allergy & Precautions, NPO status , Patient's Chart, lab work & pertinent test results  History of Anesthesia Complications Negative for: history of anesthetic complications  Airway Mallampati: II  TM Distance: >3 FB Neck ROM: Full    Dental  (+) Dental Advisory Given, Teeth Intact   Pulmonary Current Smoker and Patient abstained from smoking.,    Pulmonary exam normal        Cardiovascular hypertension, Pt. on medications Normal cardiovascular exam     Neuro/Psych negative neurological ROS  negative psych ROS   GI/Hepatic negative GI ROS, Neg liver ROS,   Endo/Other   Obesity   Renal/GU negative Renal ROS  negative genitourinary   Musculoskeletal negative musculoskeletal ROS (+) Umbilical hernia   Abdominal   Peds  Hematology negative hematology ROS (+)   Anesthesia Other Findings Covid test negative   Reproductive/Obstetrics                           Anesthesia Physical Anesthesia Plan  ASA: II  Anesthesia Plan: General   Post-op Pain Management:    Induction: Intravenous  PONV Risk Score and Plan: 3 and Treatment may vary due to age or medical condition, Midazolam, Ondansetron and Dexamethasone  Airway Management Planned: Oral ETT  Additional Equipment: None  Intra-op Plan:   Post-operative Plan: Extubation in OR  Informed Consent: I have reviewed the patients History and Physical, chart, labs and discussed the procedure including the risks, benefits and alternatives for the proposed anesthesia with the patient or authorized representative who has indicated his/her understanding and acceptance.     Dental advisory given  Plan Discussed with: CRNA and Anesthesiologist  Anesthesia Plan Comments:       Anesthesia Quick Evaluation

## 2020-11-15 ENCOUNTER — Other Ambulatory Visit: Payer: Self-pay

## 2020-11-15 ENCOUNTER — Ambulatory Visit (HOSPITAL_BASED_OUTPATIENT_CLINIC_OR_DEPARTMENT_OTHER)
Admission: RE | Admit: 2020-11-15 | Discharge: 2020-11-15 | Disposition: A | Discharge status: Home / Self Care | Payer: 59 | Attending: Surgery | Admitting: Surgery

## 2020-11-15 ENCOUNTER — Ambulatory Visit (HOSPITAL_BASED_OUTPATIENT_CLINIC_OR_DEPARTMENT_OTHER): Payer: 59 | Admitting: Anesthesiology

## 2020-11-15 ENCOUNTER — Encounter (HOSPITAL_BASED_OUTPATIENT_CLINIC_OR_DEPARTMENT_OTHER)
Admission: RE | Disposition: A | Discharge status: Home / Self Care | Payer: Self-pay | Source: Home / Self Care | Attending: Surgery

## 2020-11-15 ENCOUNTER — Encounter (HOSPITAL_BASED_OUTPATIENT_CLINIC_OR_DEPARTMENT_OTHER): Payer: Self-pay | Admitting: Surgery

## 2020-11-15 DIAGNOSIS — K429 Umbilical hernia without obstruction or gangrene: Secondary | ICD-10-CM | POA: Insufficient documentation

## 2020-11-15 DIAGNOSIS — Z818 Family history of other mental and behavioral disorders: Secondary | ICD-10-CM | POA: Insufficient documentation

## 2020-11-15 DIAGNOSIS — F17200 Nicotine dependence, unspecified, uncomplicated: Secondary | ICD-10-CM | POA: Insufficient documentation

## 2020-11-15 DIAGNOSIS — Z836 Family history of other diseases of the respiratory system: Secondary | ICD-10-CM | POA: Insufficient documentation

## 2020-11-15 DIAGNOSIS — Z803 Family history of malignant neoplasm of breast: Secondary | ICD-10-CM | POA: Insufficient documentation

## 2020-11-15 DIAGNOSIS — Z8042 Family history of malignant neoplasm of prostate: Secondary | ICD-10-CM | POA: Insufficient documentation

## 2020-11-15 DIAGNOSIS — Z8249 Family history of ischemic heart disease and other diseases of the circulatory system: Secondary | ICD-10-CM | POA: Diagnosis not present

## 2020-11-15 DIAGNOSIS — Z79899 Other long term (current) drug therapy: Secondary | ICD-10-CM | POA: Diagnosis not present

## 2020-11-15 DIAGNOSIS — Z833 Family history of diabetes mellitus: Secondary | ICD-10-CM | POA: Insufficient documentation

## 2020-11-15 DIAGNOSIS — Z8349 Family history of other endocrine, nutritional and metabolic diseases: Secondary | ICD-10-CM | POA: Insufficient documentation

## 2020-11-15 HISTORY — DX: Essential (primary) hypertension: I10

## 2020-11-15 HISTORY — PX: UMBILICAL HERNIA REPAIR: SHX196

## 2020-11-15 SURGERY — REPAIR, HERNIA, UMBILICAL, ADULT
Anesthesia: General | Site: Abdomen

## 2020-11-15 MED ORDER — FENTANYL CITRATE (PF) 100 MCG/2ML IJ SOLN: 25.0000 ug | INTRAMUSCULAR | Status: DC | PRN

## 2020-11-15 MED ORDER — 0.9 % SODIUM CHLORIDE (POUR BTL) OPTIME
TOPICAL | Status: DC | PRN
  Administered 2020-11-15: 1000 mL

## 2020-11-15 MED ORDER — MIDAZOLAM HCL 2 MG/2ML IJ SOLN
INTRAMUSCULAR | Status: AC
  Filled 2020-11-15: qty 2

## 2020-11-15 MED ORDER — SUGAMMADEX SODIUM 500 MG/5ML IV SOLN
INTRAVENOUS | Status: AC
  Filled 2020-11-15: qty 5

## 2020-11-15 MED ORDER — PROPOFOL 500 MG/50ML IV EMUL
INTRAVENOUS | Status: AC
  Filled 2020-11-15: qty 50

## 2020-11-15 MED ORDER — CELECOXIB 200 MG PO CAPS
200.0000 mg | ORAL_CAPSULE | ORAL | Status: AC
  Administered 2020-11-15: 200 mg via ORAL

## 2020-11-15 MED ORDER — ROCURONIUM BROMIDE 100 MG/10ML IV SOLN
INTRAVENOUS | Status: DC | PRN
  Administered 2020-11-15: 10 mg via INTRAVENOUS
  Administered 2020-11-15: 50 mg via INTRAVENOUS

## 2020-11-15 MED ORDER — ROCURONIUM BROMIDE 10 MG/ML (PF) SYRINGE
PREFILLED_SYRINGE | INTRAVENOUS | Status: AC
  Filled 2020-11-15: qty 10

## 2020-11-15 MED ORDER — PROMETHAZINE HCL 25 MG/ML IJ SOLN: 6.2500 mg | INTRAMUSCULAR | Status: DC | PRN

## 2020-11-15 MED ORDER — SUGAMMADEX SODIUM 500 MG/5ML IV SOLN
INTRAVENOUS | Status: DC | PRN
  Administered 2020-11-15: 300 mg via INTRAVENOUS

## 2020-11-15 MED ORDER — ALBUTEROL SULFATE HFA 108 (90 BASE) MCG/ACT IN AERS
INHALATION_SPRAY | RESPIRATORY_TRACT | Status: DC | PRN
  Administered 2020-11-15: 2 via RESPIRATORY_TRACT

## 2020-11-15 MED ORDER — MIDAZOLAM HCL 5 MG/5ML IJ SOLN
INTRAMUSCULAR | Status: DC | PRN
  Administered 2020-11-15: 2 mg via INTRAVENOUS

## 2020-11-15 MED ORDER — PROPOFOL 10 MG/ML IV BOLUS
INTRAVENOUS | Status: DC | PRN
  Administered 2020-11-15: 300 mg via INTRAVENOUS

## 2020-11-15 MED ORDER — LACTATED RINGERS IV SOLN: INTRAVENOUS | Status: DC

## 2020-11-15 MED ORDER — FENTANYL CITRATE (PF) 100 MCG/2ML IJ SOLN
INTRAMUSCULAR | Status: DC | PRN
  Administered 2020-11-15 (×2): 50 ug via INTRAVENOUS

## 2020-11-15 MED ORDER — OXYCODONE HCL 5 MG PO TABS: 5.0000 mg | ORAL_TABLET | Freq: Once | ORAL | Status: DC | PRN

## 2020-11-15 MED ORDER — FENTANYL CITRATE (PF) 100 MCG/2ML IJ SOLN
INTRAMUSCULAR | Status: AC
  Filled 2020-11-15: qty 2

## 2020-11-15 MED ORDER — ACETAMINOPHEN 500 MG PO TABS
ORAL_TABLET | ORAL | Status: AC
  Filled 2020-11-15: qty 2

## 2020-11-15 MED ORDER — LIDOCAINE 2% (20 MG/ML) 5 ML SYRINGE
INTRAMUSCULAR | Status: AC
  Filled 2020-11-15: qty 5

## 2020-11-15 MED ORDER — IBUPROFEN 800 MG PO TABS: 800.0000 mg | ORAL_TABLET | Freq: Three times a day (TID) | ORAL | 0 refills | Status: AC | PRN

## 2020-11-15 MED ORDER — CHLORHEXIDINE GLUCONATE CLOTH 2 % EX PADS: 6.0000 | MEDICATED_PAD | Freq: Once | CUTANEOUS | Status: DC

## 2020-11-15 MED ORDER — SODIUM CHLORIDE 0.9 % IV SOLN
INTRAVENOUS | Status: AC
  Filled 2020-11-15: qty 10

## 2020-11-15 MED ORDER — OXYCODONE HCL 5 MG PO TABS: 5.0000 mg | ORAL_TABLET | Freq: Four times a day (QID) | ORAL | 0 refills | Status: AC | PRN

## 2020-11-15 MED ORDER — CEFAZOLIN IN SODIUM CHLORIDE 3-0.9 GM/100ML-% IV SOLN
3.0000 g | INTRAVENOUS | Status: DC
  Administered 2020-11-15: 3 g via INTRAVENOUS
  Filled 2020-11-15: qty 100

## 2020-11-15 MED ORDER — ALBUTEROL SULFATE HFA 108 (90 BASE) MCG/ACT IN AERS
INHALATION_SPRAY | RESPIRATORY_TRACT | Status: AC
  Filled 2020-11-15: qty 6.7

## 2020-11-15 MED ORDER — DEXAMETHASONE SODIUM PHOSPHATE 10 MG/ML IJ SOLN
INTRAMUSCULAR | Status: AC
  Filled 2020-11-15: qty 1

## 2020-11-15 MED ORDER — OXYCODONE HCL 5 MG/5ML PO SOLN
5.0000 mg | Freq: Once | ORAL | Status: DC | PRN
Start: 2020-11-15 — End: 2020-11-15

## 2020-11-15 MED ORDER — CEFAZOLIN SODIUM-DEXTROSE 2-4 GM/100ML-% IV SOLN
INTRAVENOUS | Status: AC
  Filled 2020-11-15: qty 100

## 2020-11-15 MED ORDER — ONDANSETRON HCL 4 MG/2ML IJ SOLN
INTRAMUSCULAR | Status: AC
  Filled 2020-11-15: qty 2

## 2020-11-15 MED ORDER — LIDOCAINE HCL (CARDIAC) PF 100 MG/5ML IV SOSY
PREFILLED_SYRINGE | INTRAVENOUS | Status: DC | PRN
  Administered 2020-11-15: 60 mg via INTRAVENOUS

## 2020-11-15 MED ORDER — BUPIVACAINE HCL (PF) 0.25 % IJ SOLN
INTRAMUSCULAR | Status: DC | PRN
  Administered 2020-11-15: 20 mL

## 2020-11-15 MED ORDER — DEXTROSE 5 % IV SOLN
3.0000 g | INTRAVENOUS | Status: DC
  Filled 2020-11-15: qty 3000

## 2020-11-15 MED ORDER — DEXAMETHASONE SODIUM PHOSPHATE 4 MG/ML IJ SOLN
INTRAMUSCULAR | Status: DC | PRN
  Administered 2020-11-15: 5 mg via INTRAVENOUS

## 2020-11-15 MED ORDER — ACETAMINOPHEN 500 MG PO TABS
1000.0000 mg | ORAL_TABLET | ORAL | Status: AC
  Administered 2020-11-15: 1000 mg via ORAL

## 2020-11-15 MED ORDER — ONDANSETRON HCL 4 MG/2ML IJ SOLN
INTRAMUSCULAR | Status: DC | PRN
  Administered 2020-11-15: 4 mg via INTRAVENOUS

## 2020-11-15 MED ORDER — CELECOXIB 200 MG PO CAPS
ORAL_CAPSULE | ORAL | Status: AC
  Filled 2020-11-15: qty 1

## 2020-11-15 SURGICAL SUPPLY — 49 items
ADH SKN CLS APL DERMABOND .7 (GAUZE/BANDAGES/DRESSINGS) ×1
APL PRP STRL LF DISP 70% ISPRP (MISCELLANEOUS) ×1
APL SKNCLS STERI-STRIP NONHPOA (GAUZE/BANDAGES/DRESSINGS)
BENZOIN TINCTURE PRP APPL 2/3 (GAUZE/BANDAGES/DRESSINGS) IMPLANT
BINDER ABDOMINAL 12 ML 46-62 (SOFTGOODS) ×1 IMPLANT
BLADE CLIPPER SURG (BLADE) ×1 IMPLANT
BLADE SURG 10 STRL SS (BLADE) IMPLANT
BLADE SURG 15 STRL LF DISP TIS (BLADE) ×1 IMPLANT
BLADE SURG 15 STRL SS (BLADE) ×2
CANISTER SUCT 1200ML W/VALVE (MISCELLANEOUS) IMPLANT
CHLORAPREP W/TINT 26 (MISCELLANEOUS) ×2 IMPLANT
COVER BACK TABLE 60X90IN (DRAPES) ×2 IMPLANT
COVER MAYO STAND STRL (DRAPES) ×2 IMPLANT
COVER WAND RF STERILE (DRAPES) IMPLANT
DECANTER SPIKE VIAL GLASS SM (MISCELLANEOUS) ×1 IMPLANT
DERMABOND ADVANCED (GAUZE/BANDAGES/DRESSINGS) ×1
DERMABOND ADVANCED .7 DNX12 (GAUZE/BANDAGES/DRESSINGS) ×1 IMPLANT
DRAPE LAPAROTOMY TRNSV 102X78 (DRAPES) ×2 IMPLANT
DRAPE UTILITY XL STRL (DRAPES) ×2 IMPLANT
DRSG TEGADERM 4X4.75 (GAUZE/BANDAGES/DRESSINGS) IMPLANT
ELECT COATED BLADE 2.86 ST (ELECTRODE) ×2 IMPLANT
ELECT REM PT RETURN 9FT ADLT (ELECTROSURGICAL) ×2
ELECTRODE REM PT RTRN 9FT ADLT (ELECTROSURGICAL) ×1 IMPLANT
GLOVE SRG 8 PF TXTR STRL LF DI (GLOVE) ×1 IMPLANT
GLOVE SURG LTX SZ8 (GLOVE) ×2 IMPLANT
GLOVE SURG UNDER POLY LF SZ8 (GLOVE) ×2
GOWN STRL REUS W/ TWL LRG LVL3 (GOWN DISPOSABLE) ×2 IMPLANT
GOWN STRL REUS W/ TWL XL LVL3 (GOWN DISPOSABLE) ×1 IMPLANT
GOWN STRL REUS W/TWL LRG LVL3 (GOWN DISPOSABLE) ×4
GOWN STRL REUS W/TWL XL LVL3 (GOWN DISPOSABLE) ×2
NDL HYPO 25X1 1.5 SAFETY (NEEDLE) ×1 IMPLANT
NEEDLE HYPO 22GX1.5 SAFETY (NEEDLE) ×1 IMPLANT
NEEDLE HYPO 25X1 1.5 SAFETY (NEEDLE) ×2 IMPLANT
NS IRRIG 1000ML POUR BTL (IV SOLUTION) ×1 IMPLANT
PACK BASIN DAY SURGERY FS (CUSTOM PROCEDURE TRAY) ×2 IMPLANT
PENCIL SMOKE EVACUATOR (MISCELLANEOUS) ×2 IMPLANT
SLEEVE SCD COMPRESS KNEE MED (STOCKING) ×2 IMPLANT
STAPLER VISISTAT 35W (STAPLE) IMPLANT
STRIP CLOSURE SKIN 1/2X4 (GAUZE/BANDAGES/DRESSINGS) IMPLANT
SUT MON AB 4-0 PC3 18 (SUTURE) ×2 IMPLANT
SUT NOVA NAB DX-16 0-1 5-0 T12 (SUTURE) ×1 IMPLANT
SUT SILK 3 0 SH 30 (SUTURE) IMPLANT
SUT VIC AB 2-0 SH 27 (SUTURE) ×2
SUT VIC AB 2-0 SH 27XBRD (SUTURE) ×1 IMPLANT
SUT VICRYL 3-0 CR8 SH (SUTURE) ×2 IMPLANT
SYR CONTROL 10ML LL (SYRINGE) ×2 IMPLANT
TOWEL GREEN STERILE FF (TOWEL DISPOSABLE) ×2 IMPLANT
TUBE CONNECTING 20X1/4 (TUBING) IMPLANT
YANKAUER SUCT BULB TIP NO VENT (SUCTIONS) IMPLANT

## 2020-11-15 NOTE — Op Note (Signed)
Daniel Shea 04/07/1981 [id number] 11/15/2020  Preoperative diagnosis: umbilical hernia reducible   Postoperative diagnosis: same   Procedure: Umbilical Hernia Repair  Surgeon: Dortha Schwalbe, MD, FACS  Anesthesia: General and local    Clinical History and Indications: Patient presents presents for repair of umbilical hernia.  Options of repair were discussed to include mesh and laparoscopic techniques.  This is quite small measuring about 5 mm on CT scan.  After describing the techniques, complications, long-term expectations he proceeded with open repair of umbilical hernia.The risk of hernia repair include bleeding,  Infection,   Recurrence of the hernia,  Mesh use, chronic pain,  Organ injury,  Bowel injury,  Bladder injury,   nerve injury with numbness around the incision,  Death,  and worsening of preexisting  medical problems.  The alternatives to surgery have been discussed as well..  Long term expectations of both operative and non operative treatments have been discussed.   The patient agrees to proceed.  Procedure: The patient was seen in the preoperative area and the plans for the procedure reviewed again. He  had no further questions. I marked the area of the umbilicus as the operative site. He wishes to prodeed.  The patient was taken to the operating room and after satisfactory general anesthesia had been obtained the area was clipped as needed, prepped and draped. The timeout was performed.  I used some quarter percent Marcaine plain anesthesia to help with postoperative pain management. This was infiltrated around the umbilical area and additional infiltrated as I worked.  A curvilinear incision was made on the inferior aspect of the umbilicus. The umbilical skin was elevated off of the hernia sac. The hernia sac was dissected free of the subcutaneous tissues.  Patient very small fascial defect with a small piece of preperitoneal fat incarcerated within this.  This went  to the just the right of the umbilicus.  This was dissected free of the umbilicus.  The preperitoneal fat was returned into the preperitoneal space.  The defect measured about 5 mm.  I closed this with 2 figure-of-eight sutures #1 Novafil.  Hemostasis was achieved.  Once the repair was complete the incision was closed by using some 3-0 Vicryl subcutaneous and 4-0 Monocryl subcuticular sutures.  The patient tolerated the procedure well. There were no operative complications. There was minimal blood loss. All counts were correct. He was taken to the PACU in satisfactory condition.  Dortha Schwalbe, MD, FACS 11/15/2020 9:26 AM

## 2020-11-15 NOTE — Transfer of Care (Signed)
Immediate Anesthesia Transfer of Care Note  Patient: Daniel Shea  Procedure(s) Performed: REPAIR UMBILICAL HERNIA (N/A Abdomen)  Patient Location: PACU  Anesthesia Type:General  Level of Consciousness: drowsy, patient cooperative and responds to stimulation  Airway & Oxygen Therapy: Patient Spontanous Breathing and Patient connected to face mask oxygen  Post-op Assessment: Report given to RN and Post -op Vital signs reviewed and stable  Post vital signs: Reviewed and stable  Last Vitals:  Vitals Value Taken Time  BP    Temp    Pulse    Resp    SpO2      Last Pain:  Vitals:   11/15/20 0657  TempSrc: Oral  PainSc: 0-No pain      Patients Stated Pain Goal: 8 (11/15/20 0657)  Complications: No complications documented.

## 2020-11-15 NOTE — Interval H&P Note (Signed)
History and Physical Interval Note:  11/15/2020 8:18 AM  Daniel Shea  has presented today for surgery, with the diagnosis of UMBILICAL HERNIA.  The various methods of treatment have been discussed with the patient and family. After consideration of risks, benefits and other options for treatment, the patient has consented to  Procedure(s): REPAIR UMBILICAL HERNIA WITH MESH (N/A) as a surgical intervention.  The patient's history has been reviewed, patient examined, no change in status, stable for surgery.  I have reviewed the patient's chart and labs.  Questions were answered to the patient's satisfaction.     Kaylee Wombles A Azlee Monforte

## 2020-11-15 NOTE — Anesthesia Procedure Notes (Signed)
Procedure Name: Intubation Date/Time: 11/15/2020 8:30 AM Performed by: Thornell Mule, CRNA Pre-anesthesia Checklist: Patient identified, Emergency Drugs available, Suction available and Patient being monitored Patient Re-evaluated:Patient Re-evaluated prior to induction Oxygen Delivery Method: Circle system utilized Preoxygenation: Pre-oxygenation with 100% oxygen Induction Type: IV induction Ventilation: Mask ventilation without difficulty Laryngoscope Size: Miller and 3 Grade View: Grade I Tube type: Oral Tube size: 7.5 mm Number of attempts: 1 Airway Equipment and Method: Stylet and Oral airway Placement Confirmation: ETT inserted through vocal cords under direct vision,  positive ETCO2 and breath sounds checked- equal and bilateral Secured at: 23 cm Tube secured with: Tape Dental Injury: Teeth and Oropharynx as per pre-operative assessment

## 2020-11-15 NOTE — H&P (Signed)
Daniel Shea  Location: [facility] Patient #: [id number] DOB: 1981/04/18 Married / Language: English / Race: Black or African American Male  History of Present Illness  Patient words: Patient is sent at the request of Dr Mallie Snooks due to periumbilical pain 3 months. The patient states he "a burning pain sensation around his umbilicus which actually got worse back in February. He was seen by his primary care physician who ordered a computed tomography scan which showed a small periumbilical hernia without signs of incarceration or strangulation. Currently, the patient is pain free. He has no nausea or vomiting. No change in bowel or bladder function. Computed tomography scan shows that the umbilical hernia is to the right of the umbilicus without complicating feature.  The patient is a 40 year old male.   Past Surgical History Oral Surgery  Diagnostic Studies History  Colonoscopy never  Allergies No Known Drug Allergies Allergies Reconciled  Medication History amLODIPine Besylate (5MG  Tablet, Oral) Active. Medications Reconciled hydroCHLOROthiazide (50MG  Tablet, Oral) Active. Lamisil (1% Cream, External) Active.  Social History  Alcohol use Moderate alcohol use. Caffeine use Carbonated beverages, Coffee. Illicit drug use Uses socially only. Tobacco use Current every day smoker.  Family History Alcohol Abuse Family Members In General. Breast Cancer Family Members In General. Depression Brother. Diabetes Mellitus Father. Hypertension Family Members In General, Mother. Prostate Cancer Family Members In General. Respiratory Condition Family Members In General. Thyroid problems Mother.  Other Problems  Alcohol Abuse Back Pain High blood pressure Other disease, cancer, significant illness     Review of Systems General Not Present- Appetite Loss, Chills, Fatigue, Fever, Night Sweats, Weight Gain and Weight  Loss. Skin Not Present- Change in Wart/Mole, Dryness, Hives, Jaundice, New Lesions, Non-Healing Wounds, Rash and Ulcer. HEENT Not Present- Earache, Hearing Loss, Hoarseness, Nose Bleed, Oral Ulcers, Ringing in the Ears, Seasonal Allergies, Sinus Pain, Sore Throat, Visual Disturbances, Wears glasses/contact lenses and Yellow Eyes. Respiratory Not Present- Bloody sputum, Chronic Cough, Difficulty Breathing, Snoring and Wheezing. Breast Not Present- Breast Mass, Breast Pain, Nipple Discharge and Skin Changes. Cardiovascular Not Present- Chest Pain, Difficulty Breathing Lying Down, Leg Cramps, Palpitations, Rapid Heart Rate, Shortness of Breath and Swelling of Extremities. Gastrointestinal Present- Abdominal Pain. Not Present- Bloating, Bloody Stool, Change in Bowel Habits, Chronic diarrhea, Constipation, Difficulty Swallowing, Excessive gas, Gets full quickly at meals, Hemorrhoids, Indigestion, Nausea, Rectal Pain and Vomiting. Male Genitourinary Not Present- Blood in Urine, Change in Urinary Stream, Frequency, Impotence, Nocturia, Painful Urination, Urgency and Urine Leakage.  Vitals  10/09/2020 11:22 AM Weight: 296.25 lb Height: 73in Body Surface Area: 2.54 m Body Mass Index: 39.09 kg/m  Temp.: 98.29F  Pulse: 94 (Regular)  P.OX: 97% (Room air) BP: 160/90(Sitting, Left Arm, Standard)        Physical Exam   General Mental Status-Alert. General Appearance-Consistent with stated age. Hydration-Well hydrated. Voice-Normal.  Head and Neck Head-normocephalic, atraumatic with no lesions or palpable masses. Trachea-midline. Thyroid Gland Characteristics - normal size and consistency.  Chest and Lung Exam Chest and lung exam reveals -quiet, even and easy respiratory effort with no use of accessory muscles and on auscultation, normal breath sounds, no adventitious sounds and normal vocal resonance. Inspection Chest Wall - Normal. Back -  normal.  Cardiovascular Cardiovascular examination reveals -normal heart sounds, regular rate and rhythm with no murmurs and normal pedal pulses bilaterally.  Abdomen Note: Small umbilical hernia noted. This is just to the right of the umbilicus. It is small and reducible. Mild tenderness to palpation. No  signs of incarceration or strangulation on today's exam.  Neurologic Neurologic evaluation reveals -alert and oriented x 3 with no impairment of recent or remote memory. Mental Status-Normal.  Musculoskeletal Normal Exam - Left-Upper Extremity Strength Normal and Lower Extremity Strength Normal. Normal Exam - Right-Upper Extremity Strength Normal and Lower Extremity Strength Normal.    Assessment & Plan   UMBILICAL HERNIA WITHOUT OBSTRUCTION AND WITHOUT GANGRENE (K42.9) Impression: Recommend repair of his umbilical hernia with mesh since it is symptomatic. The risk of hernia repair include bleeding, infection, organ injury, bowel injury, bladder injury, nerve injury recurrent hernia, blood clots, worsening of underlying condition, chronic pain, mesh use, open surgery, death, and the need for other operations. Pt agrees to proceed  Current Plans Pt Education - [facility] Mesh education: discussed with patient and provided information. Pt Education - Pamphlet Given - Hernia Surgery: discussed with patient and provided information. [facility] Consent - Hernia Repair - Ventral/Incisional/Umbilical (: discussed with patient and provided information.

## 2020-11-15 NOTE — Discharge Instructions (Signed)
[facility] _______[facility] ° °UMBILICAL OR INGUINAL HERNIA REPAIR: POST OP INSTRUCTIONS ° °Always review your discharge instruction sheet given to you by the facility where your surgery was performed. °IF YOU HAVE DISABILITY OR FAMILY LEAVE FORMS, YOU MUST BRING THEM TO THE OFFICE FOR PROCESSING.   °DO NOT GIVE THEM TO YOUR DOCTOR. ° °1. A  prescription for pain medication may be given to you upon discharge.  Take your pain medication as prescribed, if needed.  If narcotic pain medicine is not needed, then you may take acetaminophen (Tylenol) or ibuprofen (Advil) as needed. °2. Take your usually prescribed medications unless otherwise directed. °If you need a refill on your pain medication, please contact your pharmacy.  They will contact our office to request authorization. Prescriptions will not be filled after 5 pm or on week-ends. °3. You should follow a light diet the first 24 hours after arrival home, such as soup and crackers, etc.  Be sure to include lots of fluids daily.  Resume your normal diet the day after surgery. °4.Most patients will experience some swelling and bruising around the umbilicus or in the groin and scrotum.  Ice packs and reclining will help.  Swelling and bruising can take several days to resolve.  °6. It is common to experience some constipation if taking pain medication after surgery.  Increasing fluid intake and taking a stool softener (such as Colace) will usually help or prevent this problem from occurring.  A mild laxative (Milk of Magnesia or Miralax) should be taken according to package directions if there are no bowel movements after 48 hours. °7. Unless discharge instructions indicate otherwise, you may remove your bandages 24-48 hours after surgery, and you may shower at that time.  You may have steri-strips (small skin tapes) in place directly over the incision.  These strips should be left on the skin for 7-10 days.  If your surgeon used skin glue on the  incision, you may shower in 24 hours.  The glue will flake off over the next 2-3 weeks.  Any sutures or staples will be removed at the office during your follow-up visit. °8. ACTIVITIES:  You may resume regular (light) daily activities beginning the next day--such as daily self-care, walking, climbing stairs--gradually increasing activities as tolerated.  You may have sexual intercourse when it is comfortable.  Refrain from any heavy lifting or straining until approved by your doctor. ° °a.You may drive when you are no longer taking prescription pain medication, you can comfortably wear a seatbelt, and you can safely maneuver your car and apply brakes. °b.RETURN TO WORK:   °_____________________________________________ ° °9.You should see your doctor in the office for a follow-up appointment approximately 2-3 weeks after your surgery.  Make sure that you call for this appointment within a day or two after you arrive home to insure a convenient appointment time. °10.OTHER INSTRUCTIONS: _________________________ °   _____________________________________ ° °WHEN TO CALL YOUR DOCTOR: °1. Fever over 101.0 °2. Inability to urinate °3. Nausea and/or vomiting °4. Extreme swelling or bruising °5. Continued bleeding from incision. °6. Increased pain, redness, or drainage from the incision ° °The clinic staff is available to answer your questions during regular business hours.  Please don’t hesitate to call and ask to speak to one of the nurses for clinical concerns.  If you have a medical emergency, go to the nearest emergency room or call 911.  A surgeon from [facility] is always on call at the hospital ° ° °  [address], [address], [place], [place]  [address] ? ° [address] [address], [place], [place]   [address] °[phone] ? [phone] ? FAX [phone] °Web site: [email or web address] ° ° °Post Anesthesia Home Care Instructions ° °Activity: °Get plenty of rest for the remainder of the day. A  responsible individual must stay with you for 24 hours following the procedure.  °For the next 24 hours, DO NOT: °-Drive a car °-Operate machinery °-Drink alcoholic beverages °-Take any medication unless instructed by your physician °-Make any legal decisions or sign important papers. ° °Meals: °Start with liquid foods such as gelatin or soup. Progress to regular foods as tolerated. Avoid greasy, spicy, heavy foods. If nausea and/or vomiting occur, drink only clear liquids until the nausea and/or vomiting subsides. Call your physician if vomiting continues. ° °Special Instructions/Symptoms: °Your throat may feel dry or sore from the anesthesia or the breathing tube placed in your throat during surgery. If this causes discomfort, gargle with warm salt water. The discomfort should disappear within 24 hours. ° °If you had a scopolamine patch placed behind your ear for the management of post- operative nausea and/or vomiting: ° °1. The medication in the patch is effective for 72 hours, after which it should be removed.  Wrap patch in a tissue and discard in the trash. Wash hands thoroughly with soap and water. °2. You may remove the patch earlier than 72 hours if you experience unpleasant side effects which may include dry mouth, dizziness or visual disturbances. °3. Avoid touching the patch. Wash your hands with soap and water after contact with the patch. °  ° °

## 2020-11-15 NOTE — Anesthesia Postprocedure Evaluation (Signed)
Anesthesia Post Note  Patient: Daniel Shea  Procedure(s) Performed: REPAIR UMBILICAL HERNIA (N/A Abdomen)     Patient location during evaluation: PACU Anesthesia Type: General Level of consciousness: awake and alert Pain management: pain level controlled Vital Signs Assessment: post-procedure vital signs reviewed and stable Respiratory status: spontaneous breathing, nonlabored ventilation and respiratory function stable Cardiovascular status: stable and blood pressure returned to baseline Anesthetic complications: no   No complications documented.  Last Vitals:  Vitals:   11/15/20 0930 11/15/20 0947  BP: 136/82 (!) 138/94  Pulse: 84 85  Resp: 20 20  Temp:  36.7 C  SpO2: 100% 99%    Last Pain:  Vitals:   11/15/20 0947  TempSrc:   PainSc: 0-No pain                 Beryle Lathe

## 2020-11-16 ENCOUNTER — Encounter (HOSPITAL_BASED_OUTPATIENT_CLINIC_OR_DEPARTMENT_OTHER): Payer: Self-pay | Admitting: Surgery

## 2022-04-09 DIAGNOSIS — I10 Essential (primary) hypertension: Secondary | ICD-10-CM | POA: Diagnosis not present

## 2022-04-09 DIAGNOSIS — Z0000 Encounter for general adult medical examination without abnormal findings: Secondary | ICD-10-CM | POA: Diagnosis not present

## 2022-04-09 DIAGNOSIS — Z23 Encounter for immunization: Secondary | ICD-10-CM | POA: Diagnosis not present

## 2022-04-09 DIAGNOSIS — Z125 Encounter for screening for malignant neoplasm of prostate: Secondary | ICD-10-CM | POA: Diagnosis not present

## 2022-04-09 DIAGNOSIS — E119 Type 2 diabetes mellitus without complications: Secondary | ICD-10-CM | POA: Diagnosis not present

## 2022-05-21 DIAGNOSIS — R1031 Right lower quadrant pain: Secondary | ICD-10-CM | POA: Diagnosis not present

## 2022-07-22 IMAGING — CT CT ABD-PELV W/ CM
1 of 2 series · 14 of 32 positions shown, 19 images · IV contrast (iopamidol)
Comparison: None.

CLINICAL DATA: 39-year-old male with periumbilical pain.

EXAM:
CT ABDOMEN AND PELVIS WITH CONTRAST
TECHNIQUE: Multidetector CT imaging of the abdomen and pelvis was performed
using the standard protocol following bolus administration of
intravenous contrast.
CONTRAST:  100mL YDIGB6-P33 IOPAMIDOL (YDIGB6-P33) INJECTION 61%

[Series 2: abd/pelvis w/cm · axial · 0.95mm/px · z∈[-538,-53]mm · 14 of 111 slices shown, 19 images]
[im 7/111  soft-tissue]
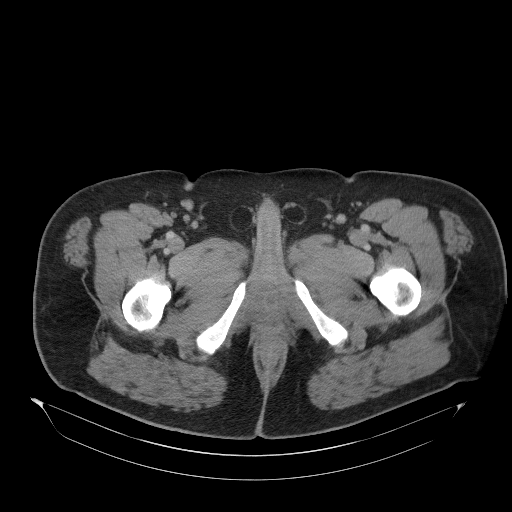
[im 7/111  bone]
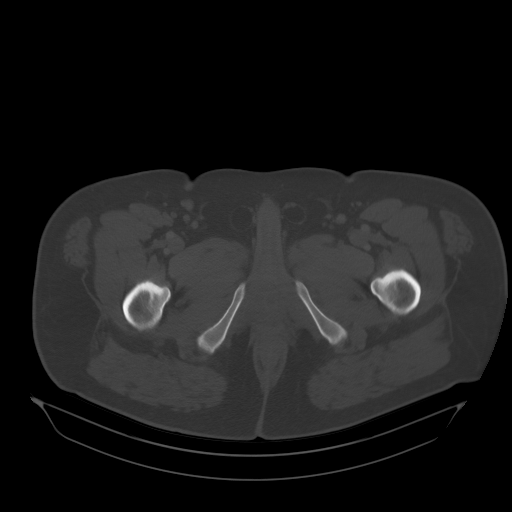
[im 13/111  soft-tissue]
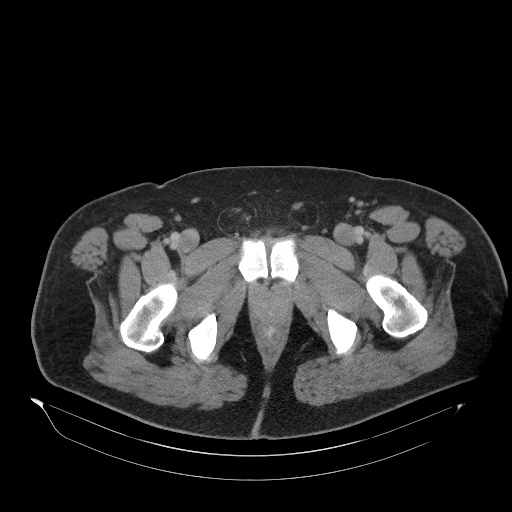
[im 26/111  soft-tissue]
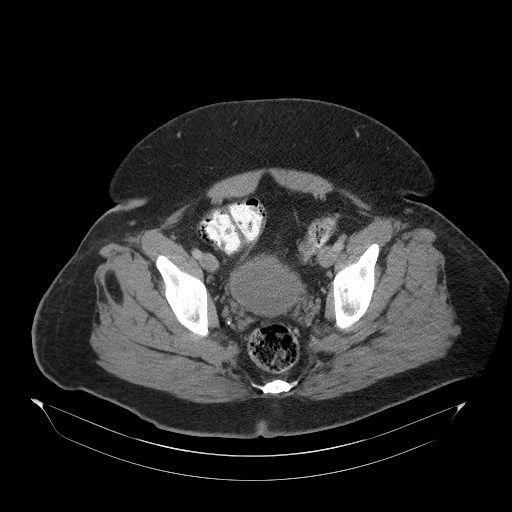
[im 33/111  soft-tissue]
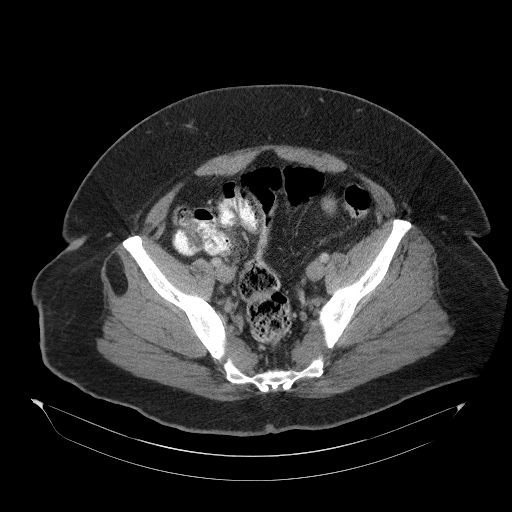
[im 39/111  soft-tissue]
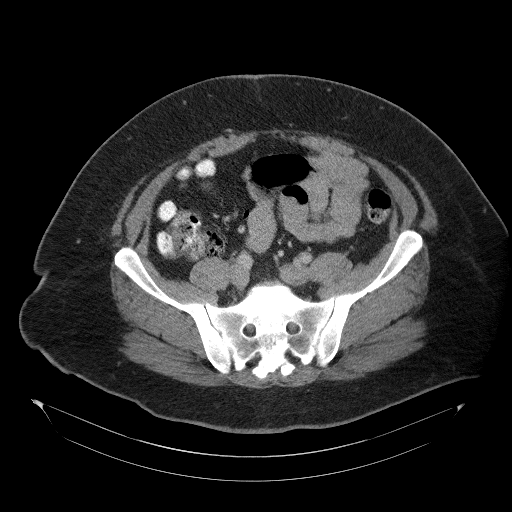
[im 46/111  soft-tissue]
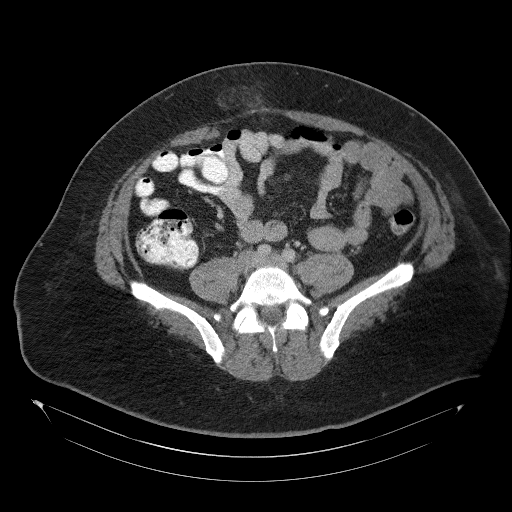
[im 59/111  soft-tissue]
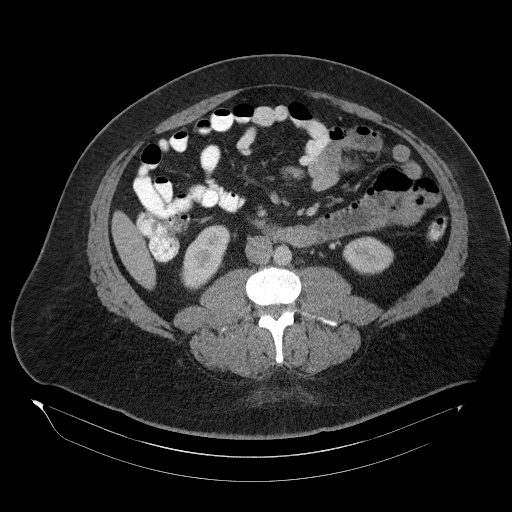
[im 65/111  soft-tissue]
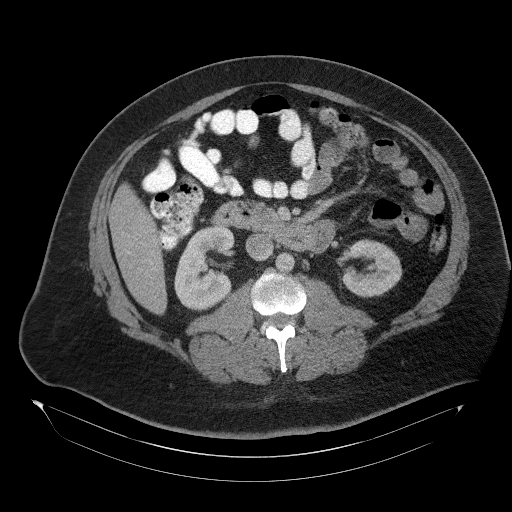
[im 72/111  soft-tissue]
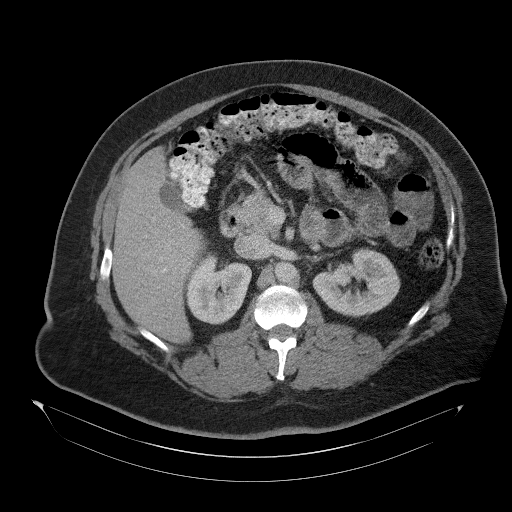
[im 72/111  bone]
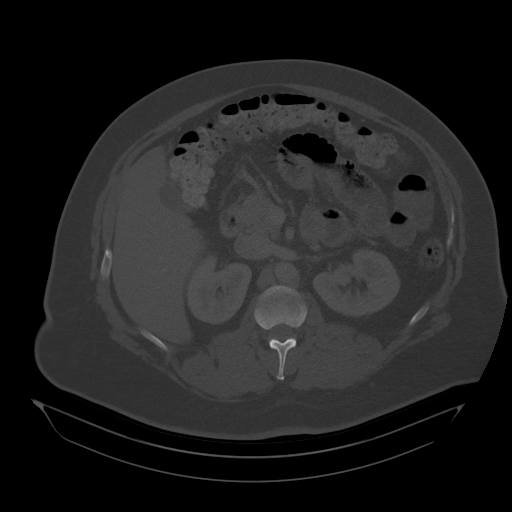
[im 78/111  soft-tissue]
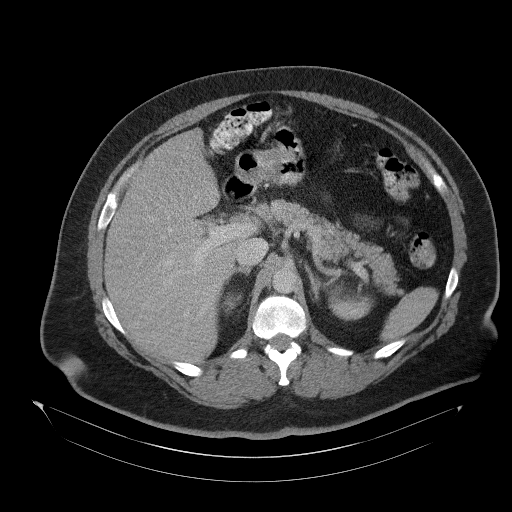
[im 85/111  soft-tissue]
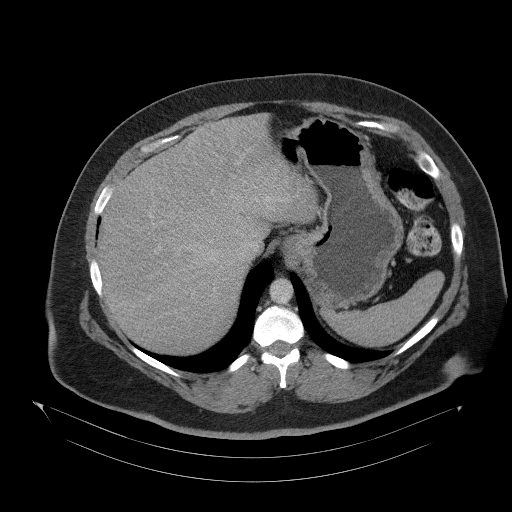
[im 85/111  lung]
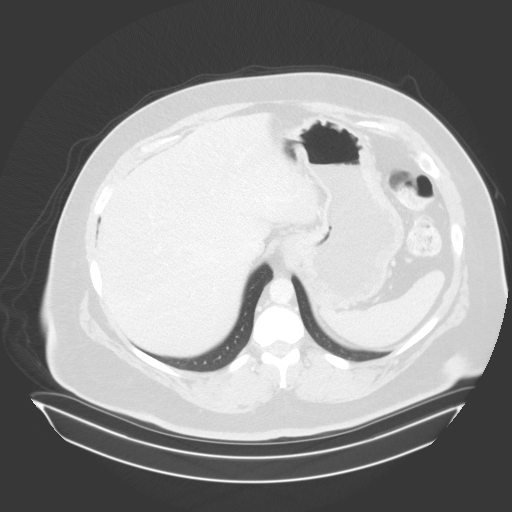
[im 91/111  lung]
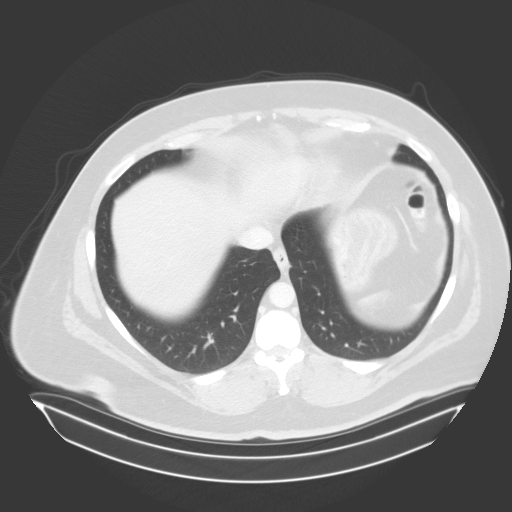
[im 98/111  soft-tissue]
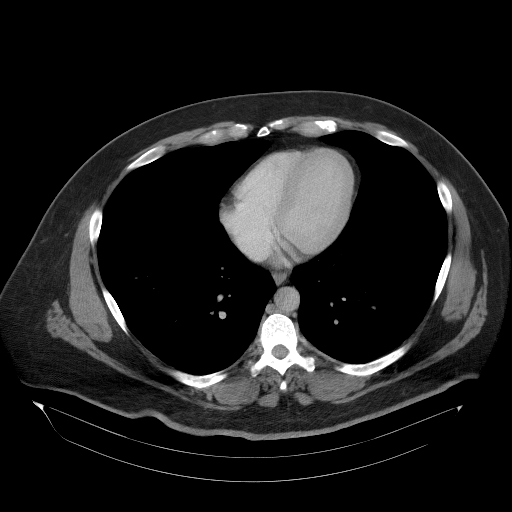
[im 98/111  lung]
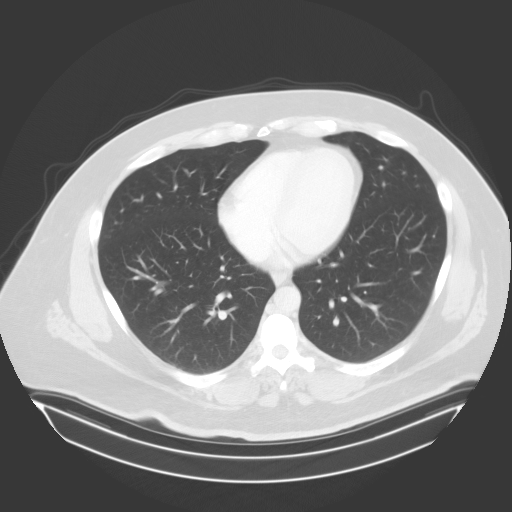
[im 104/111  soft-tissue]
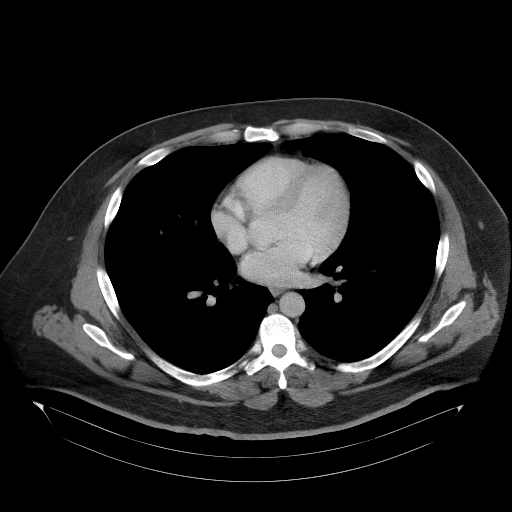
[im 104/111  lung]
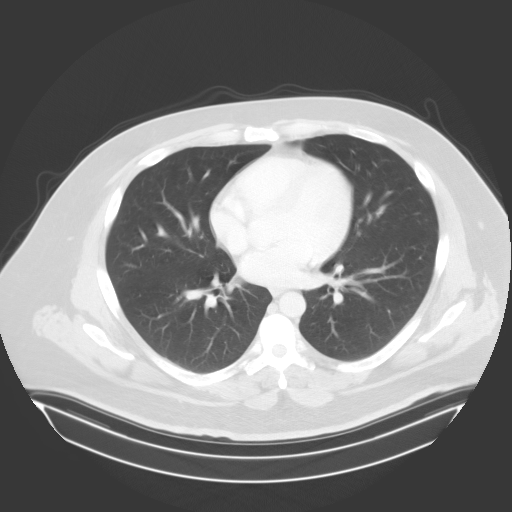

[14 of 32 positions shown; findings below may reference images not displayed]

FINDINGS: Evaluation of this exam is limited in the absence of intravenous
contrast.

Lower chest: The visualized lung bases are clear.

No intra-abdominal free air or free fluid.

Hepatobiliary: Fatty liver. No intrahepatic biliary ductal
dilatation. The gallbladder is unremarkable.

Pancreas: Unremarkable. No pancreatic ductal dilatation or
surrounding inflammatory changes.

Spleen: Normal in size without focal abnormality.

Adrenals/Urinary Tract: The adrenal glands unremarkable. The
kidneys, visualized ureters, and urinary bladder appear
unremarkable.

Stomach/Bowel: There is no bowel obstruction or active inflammation.
The appendix is normal.

Vascular/Lymphatic: The abdominal aorta and IVC unremarkable. No
portal venous gas. There is no adenopathy.

Reproductive: The prostate and seminal vesicles are grossly
unremarkable. No pelvic mass.

Other: There is a small fat containing ventral hernia superior and
to the right of the umbilicus. There is mild stranding of the
herniated omentum which may represent a degree of strangulation or
incarceration. Correlation with point tenderness recommended. No
bowel herniation or fluid collection.

Musculoskeletal: Small right gluteus maximus lipoma. No acute
osseous pathology.
IMPRESSION: 1. Small fat containing ventral hernia with possible mild
strangulation or incarceration. Correlation with point tenderness
recommended. No bowel herniation or fluid collection.
2. Fatty liver.

## 2022-07-26 DIAGNOSIS — E119 Type 2 diabetes mellitus without complications: Secondary | ICD-10-CM | POA: Diagnosis not present

## 2022-07-26 DIAGNOSIS — I10 Essential (primary) hypertension: Secondary | ICD-10-CM | POA: Diagnosis not present

## 2022-08-21 DIAGNOSIS — R7401 Elevation of levels of liver transaminase levels: Secondary | ICD-10-CM | POA: Diagnosis not present

## 2023-04-18 DIAGNOSIS — Z0000 Encounter for general adult medical examination without abnormal findings: Secondary | ICD-10-CM | POA: Diagnosis not present

## 2023-04-18 DIAGNOSIS — Z23 Encounter for immunization: Secondary | ICD-10-CM | POA: Diagnosis not present

## 2023-04-18 DIAGNOSIS — E119 Type 2 diabetes mellitus without complications: Secondary | ICD-10-CM | POA: Diagnosis not present

## 2023-04-18 DIAGNOSIS — Z13220 Encounter for screening for lipoid disorders: Secondary | ICD-10-CM | POA: Diagnosis not present

## 2023-04-18 DIAGNOSIS — I10 Essential (primary) hypertension: Secondary | ICD-10-CM | POA: Diagnosis not present

## 2023-04-18 DIAGNOSIS — Z125 Encounter for screening for malignant neoplasm of prostate: Secondary | ICD-10-CM | POA: Diagnosis not present

## 2023-05-12 ENCOUNTER — Other Ambulatory Visit: Payer: Self-pay | Admitting: Surgery

## 2023-05-12 DIAGNOSIS — R1033 Periumbilical pain: Secondary | ICD-10-CM

## 2023-05-19 ENCOUNTER — Ambulatory Visit
Admission: RE | Admit: 2023-05-19 | Discharge: 2023-05-19 | Disposition: A | Discharge status: Home / Self Care | Payer: BC Managed Care – PPO | Source: Ambulatory Visit | Attending: Surgery | Admitting: Surgery

## 2023-05-19 DIAGNOSIS — K429 Umbilical hernia without obstruction or gangrene: Secondary | ICD-10-CM | POA: Diagnosis not present

## 2023-05-19 DIAGNOSIS — K760 Fatty (change of) liver, not elsewhere classified: Secondary | ICD-10-CM | POA: Diagnosis not present

## 2023-05-19 DIAGNOSIS — R1033 Periumbilical pain: Secondary | ICD-10-CM

## 2023-05-19 DIAGNOSIS — K4090 Unilateral inguinal hernia, without obstruction or gangrene, not specified as recurrent: Secondary | ICD-10-CM | POA: Diagnosis not present

## 2023-05-19 DIAGNOSIS — K5730 Diverticulosis of large intestine without perforation or abscess without bleeding: Secondary | ICD-10-CM | POA: Diagnosis not present

## 2023-05-19 MED ORDER — IOPAMIDOL (ISOVUE-300) INJECTION 61%
100.0000 mL | Freq: Once | INTRAVENOUS | Status: AC | PRN
  Administered 2023-05-19: 100 mL via INTRAVENOUS

## 2023-05-23 ENCOUNTER — Other Ambulatory Visit: Payer: 59

## 2023-05-26 ENCOUNTER — Encounter: Payer: Self-pay | Admitting: Surgery

## 2023-10-10 DIAGNOSIS — E119 Type 2 diabetes mellitus without complications: Secondary | ICD-10-CM | POA: Diagnosis not present

## 2024-05-07 DIAGNOSIS — Z23 Encounter for immunization: Secondary | ICD-10-CM | POA: Diagnosis not present

## 2024-05-07 DIAGNOSIS — E559 Vitamin D deficiency, unspecified: Secondary | ICD-10-CM | POA: Diagnosis not present

## 2024-05-07 DIAGNOSIS — Z0000 Encounter for general adult medical examination without abnormal findings: Secondary | ICD-10-CM | POA: Diagnosis not present

## 2024-05-07 DIAGNOSIS — Z125 Encounter for screening for malignant neoplasm of prostate: Secondary | ICD-10-CM | POA: Diagnosis not present

## 2024-05-07 DIAGNOSIS — E119 Type 2 diabetes mellitus without complications: Secondary | ICD-10-CM | POA: Diagnosis not present
